# Patient Record
Sex: Female | Born: 1990 | Race: Black or African American | Hispanic: No | Marital: Single | Smoking: Current every day smoker
Health system: Southern US, Community
[De-identification: ages and names within clinical notes are randomized; demographics above are authoritative.]

## PROBLEM LIST (undated history)

## (undated) DIAGNOSIS — F1110 Opioid abuse, uncomplicated: Secondary | ICD-10-CM

## (undated) HISTORY — PX: FRACTURE SURGERY: SHX138

---
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
## 2012-02-09 ENCOUNTER — Encounter (HOSPITAL_COMMUNITY): Payer: Self-pay | Admitting: *Deleted

## 2012-02-09 ENCOUNTER — Emergency Department (HOSPITAL_COMMUNITY)
Admission: EM | Admit: 2012-02-09 | Discharge: 2012-02-09 | Discharge status: Against Medical Advice | Payer: Self-pay | Attending: Emergency Medicine | Admitting: Emergency Medicine

## 2012-02-09 DIAGNOSIS — R109 Unspecified abdominal pain: Secondary | ICD-10-CM | POA: Insufficient documentation

## 2012-02-09 LAB — URINALYSIS, ROUTINE W REFLEX MICROSCOPIC
Ketones, ur: NEGATIVE mg/dL
Leukocytes, UA: NEGATIVE
Nitrite: NEGATIVE
Protein, ur: 30 mg/dL — AB
Urobilinogen, UA: 1 mg/dL (ref 0.0–1.0)

## 2012-02-09 LAB — PREGNANCY, URINE: Preg Test, Ur: NEGATIVE

## 2012-02-09 NOTE — ED Notes (Signed)
edp in to assess pt - pt not in room, gown laying on bed.

## 2012-02-09 NOTE — ED Notes (Signed)
Upper abd pain for 2 days , No nausea or vomiting,.  Wants to be checked for Hepatitis C, says friend of hers is positive and she shared a needle with her.

## 2012-08-28 ENCOUNTER — Emergency Department (HOSPITAL_COMMUNITY)
Admission: EM | Admit: 2012-08-28 | Discharge: 2012-08-28 | Disposition: A | Discharge status: Home / Self Care | Payer: Self-pay | Attending: Emergency Medicine | Admitting: Emergency Medicine

## 2012-08-28 ENCOUNTER — Encounter (HOSPITAL_COMMUNITY): Payer: Self-pay | Admitting: *Deleted

## 2012-08-28 DIAGNOSIS — Z880 Allergy status to penicillin: Secondary | ICD-10-CM | POA: Insufficient documentation

## 2012-08-28 DIAGNOSIS — F17200 Nicotine dependence, unspecified, uncomplicated: Secondary | ICD-10-CM | POA: Insufficient documentation

## 2012-08-28 DIAGNOSIS — N390 Urinary tract infection, site not specified: Secondary | ICD-10-CM | POA: Insufficient documentation

## 2012-08-28 DIAGNOSIS — K921 Melena: Secondary | ICD-10-CM | POA: Insufficient documentation

## 2012-08-28 DIAGNOSIS — R109 Unspecified abdominal pain: Secondary | ICD-10-CM | POA: Insufficient documentation

## 2012-08-28 DIAGNOSIS — K922 Gastrointestinal hemorrhage, unspecified: Secondary | ICD-10-CM | POA: Insufficient documentation

## 2012-08-28 DIAGNOSIS — Z3202 Encounter for pregnancy test, result negative: Secondary | ICD-10-CM | POA: Insufficient documentation

## 2012-08-28 DIAGNOSIS — Z79899 Other long term (current) drug therapy: Secondary | ICD-10-CM | POA: Insufficient documentation

## 2012-08-28 LAB — CBC WITH DIFFERENTIAL/PLATELET
Basophils Absolute: 0 10*3/uL (ref 0.0–0.1)
Eosinophils Absolute: 0.1 10*3/uL (ref 0.0–0.7)
Eosinophils Relative: 1 % (ref 0–5)
HCT: 34.9 % — ABNORMAL LOW (ref 36.0–46.0)
Lymphocytes Relative: 56 % — ABNORMAL HIGH (ref 12–46)
MCH: 31.8 pg (ref 26.0–34.0)
MCV: 91.6 fL (ref 78.0–100.0)
Monocytes Absolute: 0.4 10*3/uL (ref 0.1–1.0)
Platelets: 182 10*3/uL (ref 150–400)
RDW: 12.9 % (ref 11.5–15.5)
WBC: 5.6 10*3/uL (ref 4.0–10.5)

## 2012-08-28 LAB — COMPREHENSIVE METABOLIC PANEL
ALT: 13 U/L (ref 0–35)
AST: 18 U/L (ref 0–37)
Albumin: 4 g/dL (ref 3.5–5.2)
Alkaline Phosphatase: 53 U/L (ref 39–117)
BUN: 4 mg/dL — ABNORMAL LOW (ref 6–23)
CO2: 28 mEq/L (ref 19–32)
Calcium: 9.3 mg/dL (ref 8.4–10.5)
Chloride: 102 mEq/L (ref 96–112)
Creatinine, Ser: 0.65 mg/dL (ref 0.50–1.10)
GFR calc Af Amer: >90 mL/min (ref >90)
GFR calc non Af Amer: >90 mL/min (ref >90)
Glucose, Bld: 83 mg/dL (ref 70–99)
Potassium: 4 mEq/L (ref 3.5–5.1)
Sodium: 137 mEq/L (ref 135–145)
Total Bilirubin: 0.2 mg/dL — ABNORMAL LOW (ref 0.3–1.2)
Total Protein: 7.6 g/dL (ref 6.0–8.3)

## 2012-08-28 LAB — URINALYSIS, ROUTINE W REFLEX MICROSCOPIC
Bilirubin Urine: NEGATIVE
Glucose, UA: NEGATIVE mg/dL
Hgb urine dipstick: NEGATIVE
Ketones, ur: NEGATIVE mg/dL
Nitrite: NEGATIVE
Protein, ur: NEGATIVE mg/dL
Specific Gravity, Urine: >1.03 — ABNORMAL HIGH (ref 1.005–1.030)
Urobilinogen, UA: 0.2 mg/dL (ref 0.0–1.0)
pH: 5.5 (ref 5.0–8.0)

## 2012-08-28 LAB — PREGNANCY, URINE: Preg Test, Ur: NEGATIVE

## 2012-08-28 LAB — URINE MICROSCOPIC-ADD ON

## 2012-08-28 MED ORDER — ACETAMINOPHEN 500 MG PO TABS
1000.0000 mg | ORAL_TABLET | Freq: Once | ORAL | Status: AC
  Administered 2012-08-28: 1000 mg via ORAL
  Filled 2012-08-28: qty 2

## 2012-08-28 MED ORDER — CIPROFLOXACIN HCL 250 MG PO TABS: 250.0000 mg | ORAL_TABLET | Freq: Two times a day (BID) | ORAL | Status: DC

## 2012-08-28 NOTE — ED Provider Notes (Signed)
History  This chart was scribed for Michele Racer, MD by Shari Heritage, ED Scribe. The patient was seen in room [facility]19/[facility]. Patient's care was started at 1540.   CSN: [id number]  Arrival date & time 08/28/12  1517   First MD Initiated Contact with Patient 08/28/12 1540      Chief Complaint  Patient presents with  . GI Bleeding     Patient is a 22 y.o. female presenting with hematochezia. The history is provided by the patient. No language interpreter was used.  Rectal Bleeding  The current episode started today. The problem occurs rarely. The problem has been rapidly improving. The patient is experiencing no pain. The stool is described as bloody. Associated symptoms include abdominal pain. Pertinent negatives include no fever, no diarrhea and no vomiting. She has been behaving normally. She has been eating and drinking normally. Urine output has been normal. Her past medical history does not include recent abdominal injury.     HPI Comments: Michele Mahoney is a 22 y.o. female who presents to the Emergency Department complaining of GI bleeding onset this morning. Patient states that after having a bowel movement this morning, she noticed bright red blood in the toilet and on tissue paper after wiping. She states that she hasn't had any more episodes of blood in stool or noticed any blood in her underwear. Patient has associated lower and right-sided abdominal pain. She states that she woke up with this pain this morning. She has no history of gastrointestinal problems and has never seen a gastroenterologist. She denies fever, chills, vomiting, dysuria or urinary frequency or any other symptoms at this time.   No past medical history on file.  Past Surgical History  Procedure Laterality Date  . Fracture surgery      No family history on file.  History  Substance Use Topics  . Smoking status: Current Every Day Smoker    Types: Cigarettes  . Smokeless tobacco: Not on file  . Alcohol  Use: No    OB History   Grav Para Term Preterm Abortions TAB SAB Ect Mult Living                  Review of Systems  Constitutional: Negative for fever and chills.  Gastrointestinal: Positive for abdominal pain, blood in stool and hematochezia. Negative for vomiting and diarrhea.  Genitourinary: Negative for dysuria and frequency.  All other systems reviewed and are negative.    Allergies  Amoxicillin; Ceclor; Penicillins; and Sulfa antibiotics  Home Medications   Current Outpatient Rx  Name  Route  Sig  Dispense  Refill  . medroxyPROGESTERone (DEPO-PROVERA) 150 MG/ML injection   Intramuscular   Inject 150 mg into the muscle every 3 (three) months.         . ciprofloxacin (CIPRO) 250 MG tablet   Oral   Take 1 tablet (250 mg total) by mouth every 12 (twelve) hours.   10 tablet   0     Triage Vitals: BP 110/67  Pulse 68  Temp(Src) 98.3 F (36.8 C) (Oral)  Resp 18  Ht 5\' 4"  (1.626 m)  Wt 117 lb (53.071 kg)  BMI 20.07 kg/m2  SpO2 100%  Physical Exam  Constitutional: She is oriented to person, place, and time. She appears well-developed and well-nourished. No distress.  HENT:  Head: Normocephalic and atraumatic.  Right Ear: Tympanic membrane, external ear and ear canal normal.  Left Ear: Tympanic membrane, external ear and ear canal normal.  Mouth/Throat: Oropharynx is  clear and moist.  Moist mucous membranes.  Eyes: Conjunctivae and EOM are normal. Pupils are equal, round, and reactive to light.  Neck: Normal range of motion. Neck supple.  Cardiovascular: Normal rate, regular rhythm, normal heart sounds and intact distal pulses.   No murmur heard. Pulmonary/Chest: Effort normal and breath sounds normal.  Abdominal: Soft. There is tenderness. There is no rebound and no guarding.  Mild LUQ and LLQ pain.  Genitourinary: Guaiac positive stool.  Rectal vault is clear. No anal fissures or obvious hemorrhoids. Chaperone present.  Musculoskeletal: Normal range of  motion.  Neurological: She is alert and oriented to person, place, and time. She has normal reflexes.  Moving all extremities. Sensation intact.   Skin: Skin is warm and dry. No rash noted.    ED Course  Procedures (including critical care time) DIAGNOSTIC STUDIES: Oxygen Saturation is 100% on room air, normal by my interpretation.    COORDINATION OF CARE: 3:58 PM- Patient informed of current plan for treatment and evaluation and agrees with plan at this time.    Labs Reviewed  CBC WITH DIFFERENTIAL - Abnormal; Notable for the following:    RBC 3.81 (*)    HCT 34.9 (*)    Neutrophils Relative 36 (*)    Lymphocytes Relative 56 (*)    All other components within normal limits  COMPREHENSIVE METABOLIC PANEL - Abnormal; Notable for the following:    BUN 4 (*)    Total Bilirubin 0.2 (*)    All other components within normal limits  URINALYSIS, ROUTINE W REFLEX MICROSCOPIC - Abnormal; Notable for the following:    APPearance HAZY (*)    Specific Gravity, Urine >1.030 (*)    Leukocytes, UA MODERATE (*)    All other components within normal limits  URINE MICROSCOPIC-ADD ON - Abnormal; Notable for the following:    Squamous Epithelial / LPF FEW (*)    Bacteria, UA FEW (*)    All other components within normal limits  URINE CULTURE  PREGNANCY, URINE    No results found.   1. Acute lower GI bleeding   2. UTI (urinary tract infection)       MDM  I personally performed the services described in this documentation, which was scribed in my presence. The recorded information has been reviewed and is accurate.  Remains well appearing. Abd soft. Advised to f/u with GI for recurrent bleeding. Return precautions given   Michele Racer, MD 08/28/12 1758

## 2012-08-28 NOTE — ED Notes (Signed)
Pt alert & oriented x4, stable gait. Patient given discharge instructions, paperwork & prescription(s). Patient  instructed to stop at the registration desk to finish any additional paperwork. Patient verbalized understanding. Pt left department w/ no further questions. 

## 2012-08-28 NOTE — ED Provider Notes (Deleted)
History     CSN: [id number]  Arrival date & time 08/28/12  1517   First MD Initiated Contact with Patient 08/28/12 1540      Chief Complaint  Patient presents with  . GI Bleeding    (Consider location/radiation/quality/duration/timing/severity/associated sxs/prior treatment) HPI  No past medical history on file.  Past Surgical History  Procedure Laterality Date  . Fracture surgery      No family history on file.  History  Substance Use Topics  . Smoking status: Current Every Day Smoker    Types: Cigarettes  . Smokeless tobacco: Not on file  . Alcohol Use: No    OB History   Grav Para Term Preterm Abortions TAB SAB Ect Mult Living                  Review of Systems  Allergies  Amoxicillin; Ceclor; Penicillins; and Sulfa antibiotics  Home Medications   Current Outpatient Rx  Name  Route  Sig  Dispense  Refill  . medroxyPROGESTERone (DEPO-PROVERA) 150 MG/ML injection   Intramuscular   Inject 150 mg into the muscle every 3 (three) months.         . ciprofloxacin (CIPRO) 250 MG tablet   Oral   Take 1 tablet (250 mg total) by mouth every 12 (twelve) hours.   10 tablet   0     BP 97/74  Pulse 64  Temp(Src) 98.3 F (36.8 C) (Oral)  Resp 16  Ht 5\' 4"  (1.626 m)  Wt 117 lb (53.071 kg)  BMI 20.07 kg/m2  SpO2 98%  Physical Exam  ED Course  Procedures (including critical care time)  Labs Reviewed  CBC WITH DIFFERENTIAL - Abnormal; Notable for the following:    RBC 3.81 (*)    HCT 34.9 (*)    Neutrophils Relative 36 (*)    Lymphocytes Relative 56 (*)    All other components within normal limits  COMPREHENSIVE METABOLIC PANEL - Abnormal; Notable for the following:    BUN 4 (*)    Total Bilirubin 0.2 (*)    All other components within normal limits  URINALYSIS, ROUTINE W REFLEX MICROSCOPIC - Abnormal; Notable for the following:    APPearance HAZY (*)    Specific Gravity, Urine >1.030 (*)    Leukocytes, UA MODERATE (*)    All other components  within normal limits  URINE MICROSCOPIC-ADD ON - Abnormal; Notable for the following:    Squamous Epithelial / LPF FEW (*)    Bacteria, UA FEW (*)    All other components within normal limits  URINE CULTURE  PREGNANCY, URINE   No results found.   1. Acute lower GI bleeding   2. UTI (urinary tract infection)       MDM  I personally performed the services described in this documentation, which was scribed in my presence. The recorded information has been reviewed and is accurate.  Remains well appearing. Abd soft. Advised to f/u with GI for recurrent bleeding. Return precautions given        Loren Racer, MD 08/28/12 1757

## 2012-08-28 NOTE — ED Notes (Signed)
Pt with abd pain and noted bright red blood in stool x 1 today, denies hx of same, states that she noted a clot on toilet paper as well today

## 2012-08-29 LAB — URINE CULTURE
Colony Count: NO GROWTH
Culture: NO GROWTH

## 2015-07-23 ENCOUNTER — Emergency Department (HOSPITAL_COMMUNITY)
Admission: EM | Admit: 2015-07-23 | Discharge: 2015-07-23 | Disposition: A | Discharge status: Home / Self Care | Payer: Self-pay | Attending: Emergency Medicine | Admitting: Emergency Medicine

## 2015-07-23 ENCOUNTER — Encounter (HOSPITAL_COMMUNITY): Payer: Self-pay | Admitting: Emergency Medicine

## 2015-07-23 DIAGNOSIS — F17210 Nicotine dependence, cigarettes, uncomplicated: Secondary | ICD-10-CM | POA: Insufficient documentation

## 2015-07-23 DIAGNOSIS — K0889 Other specified disorders of teeth and supporting structures: Secondary | ICD-10-CM

## 2015-07-23 MED ORDER — NAPROXEN 500 MG PO TABS: 500.0000 mg | ORAL_TABLET | Freq: Two times a day (BID) | ORAL | Status: DC

## 2015-07-23 MED ORDER — NAPROXEN 250 MG PO TABS
500.0000 mg | ORAL_TABLET | Freq: Once | ORAL | Status: AC
  Administered 2015-07-23: 500 mg via ORAL
  Filled 2015-07-23: qty 2

## 2015-07-23 MED ORDER — HYDROCODONE-ACETAMINOPHEN 5-325 MG PO TABS
1.0000 | ORAL_TABLET | Freq: Once | ORAL | Status: AC
  Administered 2015-07-23: 1 via ORAL
  Filled 2015-07-23: qty 1

## 2015-07-23 MED ORDER — HYDROCODONE-ACETAMINOPHEN 5-325 MG PO TABS: 1.0000 | ORAL_TABLET | Freq: Four times a day (QID) | ORAL | Status: DC | PRN

## 2015-07-23 MED ORDER — CLINDAMYCIN HCL 150 MG PO CAPS: 450.0000 mg | ORAL_CAPSULE | Freq: Three times a day (TID) | ORAL | Status: DC

## 2015-07-23 NOTE — ED Notes (Signed)
Patient complaining of left lower dental pain since yesterday.

## 2015-07-23 NOTE — ED Notes (Signed)
Patient given discharge instruction, verbalized understand. Patient ambulatory out of the department.  

## 2015-07-23 NOTE — Discharge Instructions (Signed)
Take antibiotic as directed. Take the Naprosyn as directed. Supplemented with the hydrocodone as needed. Referrals to possible dental clinics provided. Return for any new or worse symptoms. Return for any swelling to the floor the mouth.

## 2015-07-23 NOTE — ED Provider Notes (Signed)
CSN: [id number]     Arrival date & time 07/23/15  1942 History   First MD Initiated Contact with Patient 07/23/15 2032     Chief Complaint  Patient presents with  . Dental Pain     (Consider location/radiation/quality/duration/timing/severity/associated sxs/prior Treatment) Patient is a 25 y.o. female presenting with tooth pain. The history is provided by the patient.  Dental Pain Associated symptoms: no congestion, no drooling, no fever, no headaches and no neck pain    patient with onset of left lower molar tooth pain yesterday. No injury. No fevers. Patient is allergic to Keflex and penicillin. Past medical history is noncontributory. No fevers. No trouble swallowing. No swelling to the floor the mouth.  History reviewed. No pertinent past medical history. Past Surgical History  Procedure Laterality Date  . Fracture surgery     History reviewed. No pertinent family history. Social History  Substance Use Topics  . Smoking status: Current Every Day Smoker    Types: Cigarettes  . Smokeless tobacco: None  . Alcohol Use: No   OB History    No data available     Review of Systems  Constitutional: Negative for fever.  HENT: Positive for dental problem. Negative for congestion, drooling and voice change.   Respiratory: Negative for shortness of breath.   Cardiovascular: Negative for chest pain.  Gastrointestinal: Negative for nausea, vomiting and abdominal pain.  Musculoskeletal: Negative for neck pain.  Skin: Negative for rash.  Neurological: Negative for headaches.  Hematological: Does not bruise/bleed easily.  Psychiatric/Behavioral: Negative for confusion.      Allergies  Amoxicillin; Ceclor; Penicillins; and Sulfa antibiotics  Home Medications   Prior to Admission medications   Medication Sig Start Date End Date Taking? Authorizing Provider  ciprofloxacin (CIPRO) 250 MG tablet Take 1 tablet (250 mg total) by mouth every 12 (twelve) hours. Patient not taking:  Reported on 07/23/2015 08/28/12   Loren Raceravid Yelverton, MD  clindamycin (CLEOCIN) 150 MG capsule Take 3 capsules (450 mg total) by mouth 3 (three) times daily. 07/23/15   Vanetta MuldersScott Yaasir Menken, MD  HYDROcodone-acetaminophen (NORCO/VICODIN) 5-325 MG tablet Take 1-2 tablets by mouth every 6 (six) hours as needed. 07/23/15   Vanetta MuldersScott Zai Chmiel, MD  medroxyPROGESTERone (DEPO-PROVERA) 150 MG/ML injection Inject 150 mg into the muscle every 3 (three) months.    Historical Provider, MD  naproxen (NAPROSYN) 500 MG tablet Take 1 tablet (500 mg total) by mouth 2 (two) times daily. 07/23/15   Vanetta MuldersScott Samreen Seltzer, MD   BP 117/84 mmHg  Pulse 79  Temp(Src) 98.9 F (37.2 C) (Oral)  Resp 14  Ht 5\' 4"  (1.626 m)  Wt 54.432 kg  BMI 20.59 kg/m2  SpO2 100%  LMP 07/10/2015 Physical Exam  Constitutional: She is oriented to person, place, and time. She appears well-developed and well-nourished. No distress.  HENT:  Head: Normocephalic and atraumatic.  Mouth/Throat: Oropharynx is clear and moist.  Significant decay to bilateral lower molars. Tenderness to the left lower second molar. No adenopathy no facial swelling. No swelling to the floor the mouth.  Eyes: Conjunctivae and EOM are normal. Pupils are equal, round, and reactive to light.  Neck: Normal range of motion.  Cardiovascular: Normal rate, regular rhythm and normal heart sounds.   Pulmonary/Chest: Effort normal and breath sounds normal. No respiratory distress.  Abdominal: Soft. Bowel sounds are normal. There is no tenderness.  Neurological: She is alert and oriented to person, place, and time. No cranial nerve deficit. She exhibits normal muscle tone. Coordination normal.  Skin: Skin is  warm. No rash noted.  Nursing note and vitals reviewed.   ED Course  Procedures (including critical care time) Labs Review Labs Reviewed - No data to display  Imaging Review No results found. I have personally reviewed and evaluated these images and lab results as part of my medical  decision-making.   EKG Interpretation None      MDM   Final diagnoses:  Toothache    Patient with tenderness to palpation of left lower second molar. No swelling to the floor the mouth. Consistent with an apical abscess. Will treat with antibiotics patient is allergic to penicillin and Keflex. Will treat with clindamycin. Short course of hydrocodone and Naprosyn. Dental clinic information provided.    Vanetta Mulders, MD 07/23/15 2121

## 2015-07-24 NOTE — ED Notes (Signed)
Pharmacist from [facility] in [place] called and reports that pt could not afford the clindamycin prescribed.  Changed prescription to Erythromycin 250mg  po every 6 hours x 7 days.  No refills.

## 2016-08-02 ENCOUNTER — Encounter (HOSPITAL_COMMUNITY): Payer: Self-pay | Admitting: Emergency Medicine

## 2016-08-02 ENCOUNTER — Emergency Department (HOSPITAL_COMMUNITY)
Admission: EM | Admit: 2016-08-02 | Discharge: 2016-08-02 | Disposition: A | Discharge status: Home / Self Care | Payer: Self-pay | Attending: Emergency Medicine | Admitting: Emergency Medicine

## 2016-08-02 ENCOUNTER — Emergency Department (HOSPITAL_COMMUNITY): Payer: Self-pay

## 2016-08-02 DIAGNOSIS — M25561 Pain in right knee: Secondary | ICD-10-CM | POA: Insufficient documentation

## 2016-08-02 DIAGNOSIS — F17210 Nicotine dependence, cigarettes, uncomplicated: Secondary | ICD-10-CM | POA: Insufficient documentation

## 2016-08-02 DIAGNOSIS — Z79899 Other long term (current) drug therapy: Secondary | ICD-10-CM | POA: Insufficient documentation

## 2016-08-02 MED ORDER — IBUPROFEN 400 MG PO TABS: 400.0000 mg | ORAL_TABLET | Freq: Four times a day (QID) | ORAL | 0 refills | Status: DC | PRN

## 2016-08-02 MED ORDER — IBUPROFEN 400 MG PO TABS
400.0000 mg | ORAL_TABLET | Freq: Once | ORAL | Status: AC
  Administered 2016-08-02: 400 mg via ORAL
  Filled 2016-08-02: qty 1

## 2016-08-02 NOTE — ED Triage Notes (Signed)
Pt c/o intermittent right knee pain since Saturday. Denies injury.

## 2016-08-02 NOTE — Discharge Instructions (Signed)
As discussed, your xrays are negative today but your pain and exam suggests possible patellar tracking syndrome as the source of your symptoms.  Wear the knee sleeve for comfort.  Use ibuprofen in place of tylenol and apply an ice pack as needed for increased pain.

## 2016-08-03 NOTE — ED Provider Notes (Signed)
[facility]-EMERGENCY DEPT Provider Note   CSN: [id number] Arrival date & time: 08/02/16  1121     History   Chief Complaint Chief Complaint  Patient presents with  . Knee Pain    HPI Michele Mahoney is a 26 y.o. female presenting with a 3 day history of right aching knee pain,  Although she endorses a chronic history of occasional bilateral knee pain.  She denies specific injury and has not been overactive, no long standing or other knee stress.  Since Saturday she endorses a "popping" sensation which has been intermittent, but fairly consistent with traversing stairs.  She has had no treatments prior to arrival.  She denies fevers, chills, swelling, radiation of pain.  The history is provided by the patient and a parent.    History reviewed. No pertinent past medical history.  There are no active problems to display for this patient.   Past Surgical History:  Procedure Laterality Date  . FRACTURE SURGERY      OB History    No data available       Home Medications    Prior to Admission medications   Medication Sig Start Date End Date Taking? Authorizing Provider  ciprofloxacin (CIPRO) 250 MG tablet Take 1 tablet (250 mg total) by mouth every 12 (twelve) hours. Patient not taking: Reported on 07/23/2015 08/28/12   Loren Racer, MD  clindamycin (CLEOCIN) 150 MG capsule Take 3 capsules (450 mg total) by mouth 3 (three) times daily. 07/23/15   Vanetta Mulders, MD  HYDROcodone-acetaminophen (NORCO/VICODIN) 5-325 MG tablet Take 1-2 tablets by mouth every 6 (six) hours as needed. 07/23/15   Vanetta Mulders, MD  ibuprofen (ADVIL,MOTRIN) 400 MG tablet Take 1 tablet (400 mg total) by mouth every 6 (six) hours as needed. 08/02/16   Burgess Amor, PA-C  medroxyPROGESTERone (DEPO-PROVERA) 150 MG/ML injection Inject 150 mg into the muscle every 3 (three) months.    Historical Provider, MD  naproxen (NAPROSYN) 500 MG tablet Take 1 tablet (500 mg total) by mouth 2 (two) times daily. 07/23/15   Vanetta Mulders, MD    Family History No family history on file.  Social History Social History  Substance Use Topics  . Smoking status: Current Every Day Smoker    Types: Cigarettes  . Smokeless tobacco: Never Used  . Alcohol use No     Allergies   Amoxicillin; Ceclor [cefaclor]; Penicillins; and Sulfa antibiotics   Review of Systems Review of Systems  Constitutional: Negative for fever.  Musculoskeletal: Positive for arthralgias. Negative for joint swelling and myalgias.  Neurological: Negative for weakness and numbness.     Physical Exam Updated Vital Signs BP 107/74   Pulse 70   Temp 98.4 F (36.9 C) (Oral)   Resp 16   Ht 5\' 4"  (1.626 m)   Wt 54.4 kg   SpO2 97%   BMI 20.60 kg/m   Physical Exam  Constitutional: She appears well-developed and well-nourished.  HENT:  Head: Atraumatic.  Neck: Normal range of motion.  Cardiovascular:  Pulses equal bilaterally  Musculoskeletal: She exhibits tenderness.       Right knee: She exhibits abnormal patellar mobility. She exhibits no swelling, no effusion, no deformity, normal alignment, no LCL laxity, normal meniscus and no MCL laxity. Tenderness found. Patellar tendon tenderness noted.  Increased bilateral patellar mobility, right more than left.  There is crepitus with right knee flexion.  No pain with valgus/varus strain.  Negative drawer test.   Neurological: She is alert. She has normal strength. She  displays normal reflexes. No sensory deficit.  Skin: Skin is warm and dry.  Psychiatric: She has a normal mood and affect.     ED Treatments / Results  Labs (all labs ordered are listed, but only abnormal results are displayed) Labs Reviewed - No data to display  EKG  EKG Interpretation None       Radiology Dg Knee Complete 4 Views Right  Result Date: 08/02/2016 CLINICAL DATA:  Onset of right knee pain 4 days ago without known injury. EXAM: RIGHT KNEE - COMPLETE 4+ VIEW COMPARISON:  None in PACs FINDINGS: The  bones are subjectively adequately mineralized. There is no acute fracture or dislocation. The joint spaces are well maintained. There is no joint effusion. IMPRESSION: There is no acute or significant chronic bony abnormality of the right knee. Electronically Signed   By: David  SwazilandJordan M.D.   On: 08/02/2016 11:55    Procedures Procedures (including critical care time)  Medications Ordered in ED Medications  ibuprofen (ADVIL,MOTRIN) tablet 400 mg (400 mg Oral Given 08/02/16 1329)     Initial Impression / Assessment and Plan / ED Course  I have reviewed the triage vital signs and the nursing notes.  Pertinent labs & imaging results that were available during my care of the patient were reviewed by me and considered in my medical decision making (see chart for details).     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  Pt was given a knee sleeve for support, advised activities as tolerated. Ibuprofen.  Referral to ortho for further eval and f/u care.  Final Clinical Impressions(s) / ED Diagnoses   Final diagnoses:  Acute pain of right knee    New Prescriptions Discharge Medication List as of 08/02/2016 12:54 PM    START taking these medications   Details  ibuprofen (ADVIL,MOTRIN) 400 MG tablet Take 1 tablet (400 mg total) by mouth every 6 (six) hours as needed., Starting Tue 08/02/2016, Print         Burgess AmorJulie Dimitrios Balestrieri, PA-C 08/03/16 [id number]    Samuel JesterKathleen McManus, DO 08/05/16 1721

## 2016-11-29 ENCOUNTER — Encounter (HOSPITAL_COMMUNITY): Payer: Self-pay | Admitting: Emergency Medicine

## 2016-11-29 ENCOUNTER — Emergency Department (HOSPITAL_COMMUNITY)
Admission: EM | Admit: 2016-11-29 | Discharge: 2016-11-29 | Disposition: A | Discharge status: Home / Self Care | Payer: Self-pay | Attending: Emergency Medicine | Admitting: Emergency Medicine

## 2016-11-29 DIAGNOSIS — K047 Periapical abscess without sinus: Secondary | ICD-10-CM | POA: Insufficient documentation

## 2016-11-29 DIAGNOSIS — Z79899 Other long term (current) drug therapy: Secondary | ICD-10-CM | POA: Insufficient documentation

## 2016-11-29 DIAGNOSIS — F17210 Nicotine dependence, cigarettes, uncomplicated: Secondary | ICD-10-CM | POA: Insufficient documentation

## 2016-11-29 MED ORDER — CLINDAMYCIN HCL 150 MG PO CAPS: 450.0000 mg | ORAL_CAPSULE | Freq: Three times a day (TID) | ORAL | 0 refills | Status: AC

## 2016-11-29 MED ORDER — CLINDAMYCIN HCL 150 MG PO CAPS
300.0000 mg | ORAL_CAPSULE | Freq: Once | ORAL | Status: AC
  Administered 2016-11-29: 300 mg via ORAL
  Filled 2016-11-29: qty 2

## 2016-11-29 MED ORDER — HYDROCODONE-ACETAMINOPHEN 5-325 MG PO TABS: 1.0000 | ORAL_TABLET | ORAL | 0 refills | Status: DC | PRN

## 2016-11-29 MED ORDER — HYDROCODONE-ACETAMINOPHEN 5-325 MG PO TABS
1.0000 | ORAL_TABLET | Freq: Once | ORAL | Status: AC
  Administered 2016-11-29: 1 via ORAL
  Filled 2016-11-29: qty 1

## 2016-11-29 NOTE — ED Notes (Signed)
Pt alert & oriented x4, stable gait. Patient given discharge instructions, paperwork & prescription(s). Patient informed not to drive, operate any equipment & handel any important documents 4 hours after taking pain medication. Patient  instructed to stop at the registration desk to finish any additional paperwork. Patient  verbalized understanding. Pt left department w/ no further questions. 

## 2016-11-29 NOTE — Discharge Instructions (Signed)
Complete your entire course of antibiotics as prescribed.  You  may use the hydrocodone for pain relief but do not drive within 4 hours of taking as this will make you drowsy.  Avoid applying heat or ice to this abscess area which can worsen your symptoms.  You may use warm salt water swish and spit treatment or half peroxide and water swish and spit after meals to keep this area clean as discussed.  Call the dentist listed above for further management of your symptoms.  

## 2016-11-29 NOTE — ED Triage Notes (Signed)
Pt c/o abscess in mouth draining today.

## 2016-11-29 NOTE — ED Notes (Signed)
Noted drainage from the gum.

## 2016-11-29 NOTE — ED Provider Notes (Signed)
[facility] Provider Note   CSN: [id number] Arrival date & time: 11/29/16  2025     History   Chief Complaint Chief Complaint  Patient presents with  . Dental Pain    HPI Michele Mahoney is a 26 y.o. female one week history of dental pain and gingival swelling.   The patient has a history of injury and/or decay in the tooth involved which has recently started to cause increased  pain.  There has been no fevers, chills, nausea or vomiting, also no complaint of difficulty swallowing, although chewing makes pain worse.  She noticed a white filled blister along the gum line last week that has started draining today.The patient has had no treatment prior to arrival.   .  HPI  History reviewed. No pertinent past medical history.  There are no active problems to display for this patient.   Past Surgical History:  Procedure Laterality Date  . FRACTURE SURGERY      OB History    No data available       Home Medications    Prior to Admission medications   Medication Sig Start Date End Date Taking? Authorizing Provider  ciprofloxacin (CIPRO) 250 MG tablet Take 1 tablet (250 mg total) by mouth every 12 (twelve) hours. Patient not taking: Reported on 07/23/2015 08/28/12   Loren Racer, MD  clindamycin (CLEOCIN) 150 MG capsule Take 3 capsules (450 mg total) by mouth 3 (three) times daily. 11/29/16 12/09/16  Burgess Amor, PA-C  HYDROcodone-acetaminophen (NORCO/VICODIN) 5-325 MG tablet Take 1 tablet by mouth every 4 (four) hours as needed. 11/29/16   Burgess Amor, PA-C  ibuprofen (ADVIL,MOTRIN) 400 MG tablet Take 1 tablet (400 mg total) by mouth every 6 (six) hours as needed. 08/02/16   Burgess Amor, PA-C  medroxyPROGESTERone (DEPO-PROVERA) 150 MG/ML injection Inject 150 mg into the muscle every 3 (three) months.    [provider]  naproxen (NAPROSYN) 500 MG tablet Take 1 tablet (500 mg total) by mouth 2 (two) times daily. 07/23/15   Vanetta Mulders, MD    Family  History History reviewed. No pertinent family history.  Social History Social History  Substance Use Topics  . Smoking status: Current Every Day Smoker    Types: Cigarettes  . Smokeless tobacco: Never Used  . Alcohol use No     Allergies   Amoxicillin; Ceclor [cefaclor]; Penicillins; and Sulfa antibiotics   Review of Systems Review of Systems  Constitutional: Negative for fever.  HENT: Positive for dental problem. Negative for facial swelling and sore throat.   Respiratory: Negative for shortness of breath.   Musculoskeletal: Negative for neck pain and neck stiffness.     Physical Exam Updated Vital Signs BP 130/68 (BP Location: Right Arm)   Pulse (!) 113   Temp 98.6 F (37 C) (Oral)   Resp 16   Ht 5\' 4"  (1.626 m)   Wt 51.7 kg (114 lb)   SpO2 100%   BMI 19.57 kg/m   Physical Exam  Constitutional: She is oriented to person, place, and time. She appears well-developed and well-nourished. No distress.  HENT:  Head: Normocephalic and atraumatic.  Right Ear: Tympanic membrane and external ear normal.  Left Ear: Tympanic membrane and external ear normal.  Mouth/Throat: Oropharynx is clear and moist and mucous membranes are normal. No oral lesions. No trismus in the jaw. Dental abscesses present.    Eyes: Conjunctivae are normal.  Neck: Normal range of motion. Neck supple.  Cardiovascular: Normal rate and normal heart sounds.  Pulmonary/Chest: Effort normal.  Abdominal: She exhibits no distension.  Musculoskeletal: Normal range of motion.  Lymphadenopathy:    She has no cervical adenopathy.  Neurological: She is alert and oriented to person, place, and time.  Skin: Skin is warm and dry. No erythema.  Psychiatric: She has a normal mood and affect.     ED Treatments / Results  Labs (all labs ordered are listed, but only abnormal results are displayed) Labs Reviewed - No data to display  EKG  EKG Interpretation None       Radiology No results  found.  Procedures Procedures (including critical care time)  Medications Ordered in ED Medications  clindamycin (CLEOCIN) capsule 300 mg (300 mg Oral Given 11/29/16 2152)  HYDROcodone-acetaminophen (NORCO/VICODIN) 5-325 MG per tablet 1 tablet (1 tablet Oral Given 11/29/16 2152)     Initial Impression / Assessment and Plan / ED Course  I have reviewed the triage vital signs and the nursing notes.  Pertinent labs & imaging results that were available during my care of the patient were reviewed by me and considered in my medical decision making (see chart for details).     Dental abscess, no oropharynx edema or compromise.  Referrals given for definitive tx.  [place] controlled substance database reviewed with no entries.   Final Clinical Impressions(s) / ED Diagnoses   Final diagnoses:  Dental abscess    New Prescriptions New Prescriptions   CLINDAMYCIN (CLEOCIN) 150 MG CAPSULE    Take 3 capsules (450 mg total) by mouth 3 (three) times daily.   HYDROCODONE-ACETAMINOPHEN (NORCO/VICODIN) 5-325 MG TABLET    Take 1 tablet by mouth every 4 (four) hours as needed.     Burgess Amordol, Dianelly Ferran, PA-C 11/29/16 2158    Samuel JesterMcManus, Kathleen, DO 12/02/16 1519

## 2016-12-02 ENCOUNTER — Emergency Department (HOSPITAL_COMMUNITY): Payer: No Typology Code available for payment source

## 2016-12-02 ENCOUNTER — Encounter (HOSPITAL_COMMUNITY): Payer: Self-pay | Admitting: *Deleted

## 2016-12-02 ENCOUNTER — Emergency Department (HOSPITAL_COMMUNITY)
Admission: EM | Admit: 2016-12-02 | Discharge: 2016-12-02 | Disposition: A | Discharge status: Home / Self Care | Payer: No Typology Code available for payment source | Attending: Emergency Medicine | Admitting: Emergency Medicine

## 2016-12-02 DIAGNOSIS — Z79899 Other long term (current) drug therapy: Secondary | ICD-10-CM | POA: Diagnosis not present

## 2016-12-02 DIAGNOSIS — Y92410 Unspecified street and highway as the place of occurrence of the external cause: Secondary | ICD-10-CM | POA: Diagnosis not present

## 2016-12-02 DIAGNOSIS — F17210 Nicotine dependence, cigarettes, uncomplicated: Secondary | ICD-10-CM | POA: Insufficient documentation

## 2016-12-02 DIAGNOSIS — Y999 Unspecified external cause status: Secondary | ICD-10-CM | POA: Diagnosis not present

## 2016-12-02 DIAGNOSIS — S161XXA Strain of muscle, fascia and tendon at neck level, initial encounter: Secondary | ICD-10-CM | POA: Diagnosis not present

## 2016-12-02 DIAGNOSIS — S199XXA Unspecified injury of neck, initial encounter: Secondary | ICD-10-CM | POA: Diagnosis present

## 2016-12-02 DIAGNOSIS — Y9389 Activity, other specified: Secondary | ICD-10-CM | POA: Insufficient documentation

## 2016-12-02 MED ORDER — CYCLOBENZAPRINE HCL 5 MG PO TABS: 5.0000 mg | ORAL_TABLET | Freq: Three times a day (TID) | ORAL | 0 refills | Status: DC | PRN
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
## 2017-01-27 ENCOUNTER — Emergency Department (HOSPITAL_COMMUNITY)
Admission: EM | Admit: 2017-01-27 | Discharge: 2017-01-27 | Disposition: A | Discharge status: Home / Self Care | Payer: Self-pay | Attending: Emergency Medicine | Admitting: Emergency Medicine

## 2017-01-27 ENCOUNTER — Encounter (HOSPITAL_COMMUNITY): Payer: Self-pay | Admitting: Cardiology

## 2017-01-27 ENCOUNTER — Emergency Department (HOSPITAL_COMMUNITY): Payer: Self-pay

## 2017-01-27 DIAGNOSIS — N3001 Acute cystitis with hematuria: Secondary | ICD-10-CM | POA: Insufficient documentation

## 2017-01-27 DIAGNOSIS — Z79899 Other long term (current) drug therapy: Secondary | ICD-10-CM | POA: Insufficient documentation

## 2017-01-27 DIAGNOSIS — F17210 Nicotine dependence, cigarettes, uncomplicated: Secondary | ICD-10-CM | POA: Insufficient documentation

## 2017-01-27 LAB — URINALYSIS, ROUTINE W REFLEX MICROSCOPIC
Bilirubin Urine: NEGATIVE
Glucose, UA: NEGATIVE mg/dL
Ketones, ur: NEGATIVE mg/dL
NITRITE: POSITIVE — AB
PROTEIN: 100 mg/dL — AB
SPECIFIC GRAVITY, URINE: 1.023 (ref 1.005–1.030)
pH: 7 (ref 5.0–8.0)

## 2017-01-27 LAB — COMPREHENSIVE METABOLIC PANEL
ALBUMIN: 4.5 g/dL (ref 3.5–5.0)
ALK PHOS: 63 U/L (ref 38–126)
ALT: 15 U/L (ref 14–54)
AST: 22 U/L (ref 15–41)
Anion gap: 11 (ref 5–15)
BILIRUBIN TOTAL: 0.7 mg/dL (ref 0.3–1.2)
BUN: 11 mg/dL (ref 6–20)
CALCIUM: 9.6 mg/dL (ref 8.9–10.3)
CO2: 24 mmol/L (ref 22–32)
Chloride: 101 mmol/L (ref 101–111)
Creatinine, Ser: 0.81 mg/dL (ref 0.44–1.00)
GFR calc Af Amer: >60 mL/min (ref >60)
GFR calc non Af Amer: >60 mL/min (ref >60)
GLUCOSE: 106 mg/dL — AB (ref 65–99)
POTASSIUM: 4 mmol/L (ref 3.5–5.1)
Sodium: 136 mmol/L (ref 135–145)
TOTAL PROTEIN: 8.8 g/dL — AB (ref 6.5–8.1)

## 2017-01-27 LAB — PREGNANCY, URINE: PREG TEST UR: NEGATIVE

## 2017-01-27 LAB — LIPASE, BLOOD: Lipase: 24 U/L (ref 11–51)

## 2017-01-27 LAB — CBC
HEMATOCRIT: 40.3 % (ref 36.0–46.0)
HEMOGLOBIN: 13.3 g/dL (ref 12.0–15.0)
MCH: 31.3 pg (ref 26.0–34.0)
MCHC: 33 g/dL (ref 30.0–36.0)
MCV: 94.8 fL (ref 78.0–100.0)
Platelets: 216 10*3/uL (ref 150–400)
RBC: 4.25 MIL/uL (ref 3.87–5.11)
RDW: 13.6 % (ref 11.5–15.5)
WBC: 14.3 10*3/uL — ABNORMAL HIGH (ref 4.0–10.5)

## 2017-01-27 MED ORDER — HYDROMORPHONE HCL 1 MG/ML IJ SOLN
0.5000 mg | Freq: Once | INTRAMUSCULAR | Status: AC
  Administered 2017-01-27: 0.5 mg via INTRAVENOUS
  Filled 2017-01-27: qty 1

## 2017-01-27 MED ORDER — CIPROFLOXACIN HCL 500 MG PO TABS: 500.0000 mg | ORAL_TABLET | Freq: Two times a day (BID) | ORAL | 0 refills | Status: DC

## 2017-01-27 MED ORDER — KETOROLAC TROMETHAMINE 30 MG/ML IJ SOLN
30.0000 mg | Freq: Once | INTRAMUSCULAR | Status: AC
  Administered 2017-01-27: 30 mg via INTRAVENOUS
  Filled 2017-01-27: qty 1

## 2017-01-27 MED ORDER — CIPROFLOXACIN IN D5W 400 MG/200ML IV SOLN
400.0000 mg | Freq: Once | INTRAVENOUS | Status: AC
  Administered 2017-01-27: 400 mg via INTRAVENOUS
  Filled 2017-01-27: qty 200

## 2017-01-27 MED ORDER — TRAMADOL HCL 50 MG PO TABS: 50.0000 mg | ORAL_TABLET | Freq: Four times a day (QID) | ORAL | 0 refills | Status: DC | PRN

## 2017-01-27 MED ORDER — ONDANSETRON HCL 4 MG/2ML IJ SOLN
4.0000 mg | Freq: Once | INTRAMUSCULAR | Status: AC
  Administered 2017-01-27: 4 mg via INTRAVENOUS
  Filled 2017-01-27: qty 2

## 2017-01-27 NOTE — Discharge Instructions (Signed)
Drink plenty of fluids and follow-up with your doctor if you finish the antibiotics

## 2017-01-27 NOTE — ED Triage Notes (Signed)
Left sided abdominal pain since yesterday.  States pain is radiating into back.  Vomited times 4 .  Pain worse with urination.

## 2017-01-27 NOTE — ED Notes (Signed)
Pt ambulatory to restroom at this time. 

## 2017-01-27 NOTE — ED Triage Notes (Signed)
Reports LLQ pain since yesterday   NV 3-4 times today

## 2017-01-27 NOTE — ED Provider Notes (Signed)
[facility] Provider Note   CSN: [id number] Arrival date & time: 01/27/17  1316     History   Chief Complaint Chief Complaint  Patient presents with  . Abdominal Pain    HPI Michele Mahoney is a 26 y.o. female.  Patient complains of left lower quadrant pain and left flank pain. No fever no vomiting   The history is provided by the patient.  Abdominal Pain   This is a new problem. The current episode started 2 days ago. The problem occurs constantly. The problem has not changed since onset.The pain is associated with an unknown factor. The pain is located in the LLQ. The quality of the pain is aching. The pain is at a severity of 5/10. The pain is moderate. Pertinent negatives include diarrhea, frequency, hematuria and headaches.    History reviewed. No pertinent past medical history.  There are no active problems to display for this patient.   Past Surgical History:  Procedure Laterality Date  . FRACTURE SURGERY      OB History    No data available       Home Medications    Prior to Admission medications   Medication Sig Start Date End Date Taking? Authorizing Provider  acetaminophen (TYLENOL) 500 MG tablet Take 1,000 mg by mouth every 6 (six) hours as needed.   Yes [provider]  ibuprofen (ADVIL,MOTRIN) 200 MG tablet Take 200 mg by mouth every 6 (six) hours as needed.   Yes [provider]  ciprofloxacin (CIPRO) 500 MG tablet Take 1 tablet (500 mg total) by mouth 2 (two) times daily. One po bid x 7 days 01/27/17   Bethann Berkshire, MD  cyclobenzaprine (FLEXERIL) 5 MG tablet Take 1 tablet (5 mg total) by mouth 3 (three) times daily as needed for muscle spasms. Patient not taking: Reported on 01/27/2017 12/02/16   Burgess Amor, PA-C  HYDROcodone-acetaminophen (NORCO/VICODIN) 5-325 MG tablet Take 1 tablet by mouth every 4 (four) hours as needed. Patient not taking: Reported on 01/27/2017 11/29/16   Burgess Amor, PA-C  medroxyPROGESTERone  (DEPO-PROVERA) 150 MG/ML injection Inject 150 mg into the muscle every 3 (three) months.    [provider]  traMADol (ULTRAM) 50 MG tablet Take 1 tablet (50 mg total) by mouth every 6 (six) hours as needed. 01/27/17   Bethann Berkshire, MD    Family History History reviewed. No pertinent family history.  Social History Social History  Substance Use Topics  . Smoking status: Current Every Day Smoker    Types: Cigarettes  . Smokeless tobacco: Never Used  . Alcohol use No     Allergies   Amoxicillin; Ceclor [cefaclor]; Penicillins; and Sulfa antibiotics   Review of Systems Review of Systems  Constitutional: Negative for appetite change and fatigue.  HENT: Negative for congestion, ear discharge and sinus pressure.   Eyes: Negative for discharge.  Respiratory: Negative for cough.   Cardiovascular: Negative for chest pain.  Gastrointestinal: Positive for abdominal pain. Negative for diarrhea.  Genitourinary: Negative for frequency and hematuria.  Musculoskeletal: Positive for back pain.  Skin: Negative for rash.  Neurological: Negative for seizures and headaches.  Psychiatric/Behavioral: Negative for hallucinations.     Physical Exam Updated Vital Signs BP 112/78 (BP Location: Right Arm)   Pulse 69   Temp 98.3 F (36.8 C) (Oral)   Resp 16   Ht  (1.626 m)   Wt 52.2 kg (115 lb)   SpO2 100%   BMI 19.74 kg/m   Physical Exam  Constitutional: She is oriented to person, place, and time. She appears well-developed.  HENT:  Head: Normocephalic.  Eyes: Conjunctivae and EOM are normal. No scleral icterus.  Neck: Neck supple. No thyromegaly present.  Cardiovascular: Normal rate and regular rhythm.  Exam reveals no gallop and no friction rub.   No murmur heard. Pulmonary/Chest: No stridor. She has no wheezes. She has no rales. She exhibits no tenderness.  Abdominal: She exhibits no distension. There is no tenderness. There is no rebound.  Tender left flank and left  lower quadrant  Musculoskeletal: Normal range of motion. She exhibits no edema.  Lymphadenopathy:    She has no cervical adenopathy.  Neurological: She is oriented to person, place, and time. She exhibits normal muscle tone. Coordination normal.  Skin: No rash noted. No erythema.  Psychiatric: She has a normal mood and affect. Her behavior is normal.     ED Treatments / Results  Labs (all labs ordered are listed, but only abnormal results are displayed) Labs Reviewed  COMPREHENSIVE METABOLIC PANEL - Abnormal; Notable for the following:       Result Value   Glucose, Bld 106 (*)    Total Protein 8.8 (*)    All other components within normal limits  CBC - Abnormal; Notable for the following:    WBC 14.3 (*)    All other components within normal limits  URINALYSIS, ROUTINE W REFLEX MICROSCOPIC - Abnormal; Notable for the following:    APPearance CLOUDY (*)    Hgb urine dipstick MODERATE (*)    Protein, ur 100 (*)    Nitrite POSITIVE (*)    Leukocytes, UA LARGE (*)    Bacteria, UA RARE (*)    Squamous Epithelial / LPF 0-5 (*)    All other components within normal limits  URINE CULTURE  LIPASE, BLOOD  PREGNANCY, URINE    EKG  EKG Interpretation None       Radiology Ct Renal Stone Study  Result Date: 01/27/2017 CLINICAL DATA:  Left-sided abdominal pain and vomiting EXAM: CT ABDOMEN AND PELVIS WITHOUT CONTRAST TECHNIQUE: Multidetector CT imaging of the abdomen and pelvis was performed following the standard protocol without oral or intravenous contrast material administration. COMPARISON:  July 25, 2014 FINDINGS: Lower chest:  Lung bases are clear. Hepatobiliary: No focal liver lesions are evident on this noncontrast enhanced study. There is a Riedel's lobe on the right, an anatomic variant. Gallbladder wall is not appreciably thickened. There is no biliary duct dilatation. Pancreas: No pancreatic mass or inflammatory focus evident. Spleen: No splenic lesions are evident.  Adrenals/Urinary Tract: Adrenals appear normal bilaterally. There is no appreciable renal mass or perinephric stranding on either side. There is mild hydronephrosis on the left. There is no appreciable hydronephrosis on the right. There is 1 slight nephrocalcinosis bilaterally. There is no well-defined intrarenal calculus on either side. There is no appreciable ureteral calculus on either side. Urinary bladder is midline with wall thickness within normal limits. Stomach/Bowel: There is no appreciable bowel wall or mesenteric thickening. There is moderate stool throughout the colon. There is no evident bowel obstruction. There is no free air or portal venous air. Vascular/Lymphatic: There is no abdominal aortic aneurysm. No vascular lesions are evident on this noncontrast enhanced study. There is no appreciable adenopathy in the abdomen or pelvis. Reproductive: Uterus is retroverted. No pelvic mass evident. There are physiologic appearing follicles in each ovary. Other: Appendix is not well seen. There is no periappendiceal region inflammatory change. There is no abscess or ascites  in the abdomen or pelvis. Musculoskeletal: There are no blastic or lytic bone lesions. There is no intramuscular or abdominal wall lesion evident. IMPRESSION: 1. Mild hydronephrosis on the left. No perinephric stranding on the left. No renal or ureteral calculus evident on either side. There is slight nephrocalcinosis bilaterally. The appearance of the left kidney raises question of recent calculus passage on the left. It should be noted that early pyelonephritis on the left could present in this manner. Note that there is no demonstrable renal abscess on this noncontrast enhanced study. 2. Appendix not well seen. No right lower quadrant/periappendiceal region inflammation evident. 3. No evident abscess in the abdomen or pelvis. No bowel obstruction. Electronically Signed   By: Bretta Bang III M.D.   On: 01/27/2017 16:14     Procedures Procedures (including critical care time)  Medications Ordered in ED Medications  ketorolac (TORADOL) 30 MG/ML injection 30 mg (30 mg Intravenous Given 01/27/17 1414)  ondansetron (ZOFRAN) injection 4 mg (4 mg Intravenous Given 01/27/17 1414)  ciprofloxacin (CIPRO) IVPB 400 mg (0 mg Intravenous Stopped 01/27/17 1611)  HYDROmorphone (DILAUDID) injection 0.5 mg (0.5 mg Intravenous Given 01/27/17 1540)     Initial Impression / Assessment and Plan / ED Course  I have reviewed the triage vital signs and the nursing notes.  Pertinent labs & imaging results that were available during my care of the patient were reviewed by me and considered in my medical decision making (see chart for details).     Patient with urinary tract infection. Patient will be placed on Cipro and Ultram and will follow-up with her doctor after finishing the antibiotics  Final Clinical Impressions(s) / ED Diagnoses   Final diagnoses:  Acute cystitis with hematuria    New Prescriptions New Prescriptions   CIPROFLOXACIN (CIPRO) 500 MG TABLET    Take 1 tablet (500 mg total) by mouth 2 (two) times daily. One po bid x 7 days   TRAMADOL (ULTRAM) 50 MG TABLET    Take 1 tablet (50 mg total) by mouth every 6 (six) hours as needed.     Bethann Berkshire, MD 01/27/17 1740

## 2017-01-30 LAB — URINE CULTURE: Culture: >=100000 — AB

## 2017-01-31 ENCOUNTER — Telehealth: Payer: Self-pay | Admitting: *Deleted

## 2017-01-31 NOTE — Telephone Encounter (Signed)
Post ED Visit - Positive Culture Follow-up  Culture report reviewed by antimicrobial stewardship pharmacist:   Enzo Bi, Pharm.D.  Celedonio Miyamoto, Pharm.D., BCPS AQ-ID  Garvin Fila, Pharm.D., BCPS  Georgina Pillion, Pharm.D., BCPS  [place], [address].D., BCPS, AAHIVP  Estella Husk, Pharm.D., BCPS, AAHIVP  Lysle Pearl, PharmD, BCPS  Casilda Carls, PharmD, BCPS  Pollyann Samples, PharmD, BCPS  Positive urine culture Treated with Ciprofloxacin HCL, organism sensitive to the same and no further patient follow-up is required at this time.  Virl Axe [facility] 01/31/2017, 10:01 AM

## 2017-10-04 ENCOUNTER — Encounter (HOSPITAL_COMMUNITY): Payer: Self-pay

## 2017-10-04 ENCOUNTER — Emergency Department (HOSPITAL_COMMUNITY)
Admission: EM | Admit: 2017-10-04 | Discharge: 2017-10-04 | Disposition: A | Discharge status: Home / Self Care | Payer: Self-pay | Attending: Emergency Medicine | Admitting: Emergency Medicine

## 2017-10-04 ENCOUNTER — Other Ambulatory Visit: Payer: Self-pay

## 2017-10-04 ENCOUNTER — Emergency Department (HOSPITAL_COMMUNITY): Payer: Self-pay

## 2017-10-04 DIAGNOSIS — N390 Urinary tract infection, site not specified: Secondary | ICD-10-CM | POA: Insufficient documentation

## 2017-10-04 DIAGNOSIS — F17210 Nicotine dependence, cigarettes, uncomplicated: Secondary | ICD-10-CM | POA: Insufficient documentation

## 2017-10-04 LAB — CBC
HCT: 34.9 % — ABNORMAL LOW (ref 36.0–46.0)
Hemoglobin: 11.4 g/dL — ABNORMAL LOW (ref 12.0–15.0)
MCH: 29.9 pg (ref 26.0–34.0)
MCHC: 32.7 g/dL (ref 30.0–36.0)
MCV: 91.6 fL (ref 78.0–100.0)
Platelets: 233 10*3/uL (ref 150–400)
RBC: 3.81 MIL/uL — ABNORMAL LOW (ref 3.87–5.11)
RDW: 12.7 % (ref 11.5–15.5)
WBC: 9.9 10*3/uL (ref 4.0–10.5)

## 2017-10-04 LAB — PREGNANCY, URINE: Preg Test, Ur: NEGATIVE

## 2017-10-04 LAB — URINALYSIS, ROUTINE W REFLEX MICROSCOPIC
Bilirubin Urine: NEGATIVE
Glucose, UA: NEGATIVE mg/dL
Ketones, ur: NEGATIVE mg/dL
Nitrite: POSITIVE — AB
Protein, ur: 30 mg/dL — AB
Specific Gravity, Urine: 1.019 (ref 1.005–1.030)
pH: 5 (ref 5.0–8.0)

## 2017-10-04 LAB — COMPREHENSIVE METABOLIC PANEL
ALT: 13 U/L — ABNORMAL LOW (ref 14–54)
AST: 19 U/L (ref 15–41)
Albumin: 3.8 g/dL (ref 3.5–5.0)
Alkaline Phosphatase: 59 U/L (ref 38–126)
Anion gap: 10 (ref 5–15)
BUN: 6 mg/dL (ref 6–20)
CO2: 26 mmol/L (ref 22–32)
Calcium: 9.3 mg/dL (ref 8.9–10.3)
Chloride: 97 mmol/L — ABNORMAL LOW (ref 101–111)
Creatinine, Ser: 0.66 mg/dL (ref 0.44–1.00)
GFR calc Af Amer: >60 mL/min (ref >60)
GFR calc non Af Amer: >60 mL/min (ref >60)
Glucose, Bld: 93 mg/dL (ref 65–99)
Potassium: 3.5 mmol/L (ref 3.5–5.1)
Sodium: 133 mmol/L — ABNORMAL LOW (ref 135–145)
Total Bilirubin: 0.6 mg/dL (ref 0.3–1.2)
Total Protein: 8.3 g/dL — ABNORMAL HIGH (ref 6.5–8.1)

## 2017-10-04 LAB — LIPASE, BLOOD: Lipase: 20 U/L (ref 11–51)

## 2017-10-04 MED ORDER — CIPROFLOXACIN HCL 250 MG PO TABS
500.0000 mg | ORAL_TABLET | Freq: Once | ORAL | Status: AC
Start: 2017-10-04 — End: 2017-10-04
  Administered 2017-10-04: 500 mg via ORAL
  Filled 2017-10-04: qty 2

## 2017-10-04 MED ORDER — ACETAMINOPHEN 325 MG PO TABS
650.0000 mg | ORAL_TABLET | Freq: Once | ORAL | Status: AC
  Administered 2017-10-04: 650 mg via ORAL
  Filled 2017-10-04: qty 2

## 2017-10-04 MED ORDER — CIPROFLOXACIN HCL 500 MG PO TABS: 500.0000 mg | ORAL_TABLET | Freq: Two times a day (BID) | ORAL | 0 refills | Status: DC

## 2017-10-04 NOTE — ED Triage Notes (Addendum)
Pt reports left side pain that goes into back and mid-lower abdomen. Pain started 2 days ago. Pain increased approx one hour ago. Vomited x 1. Denies urinary symptoms Reports occasional cough

## 2017-10-07 LAB — URINE CULTURE: Culture: 60000 — AB

## 2017-10-07 NOTE — ED Provider Notes (Signed)
[facility] EMERGENCY DEPARTMENT Provider Note   CSN: [id number] Arrival date & time: 10/04/17  1317     History   Chief Complaint Chief Complaint  Patient presents with  . Abdominal Pain    HPI Michele Mahoney is a 27 y.o. female.  HPI   27 year old female with left abdominal left flank pain.  Onset about 2 days ago.  Slowly worsening.  Associated nausea and increased urinary frequency.  No dysuria.  No unusual vaginal bleeding or discharge.  Subjective fever.  She has not tried taking anything for her symptoms.  History reviewed. No pertinent past medical history.  There are no active problems to display for this patient.   Past Surgical History:  Procedure Laterality Date  . FRACTURE SURGERY       OB History   None      Home Medications    Prior to Admission medications   Medication Sig Start Date End Date Taking? Authorizing Provider  acetaminophen (TYLENOL) 500 MG tablet Take 1,000 mg by mouth every 6 (six) hours as needed.    [provider]  ciprofloxacin (CIPRO) 500 MG tablet Take 1 tablet (500 mg total) by mouth every 12 (twelve) hours. 10/04/17   Raeford RazorKohut, Julita Ozbun, MD  cyclobenzaprine (FLEXERIL) 5 MG tablet Take 1 tablet (5 mg total) by mouth 3 (three) times daily as needed for muscle spasms. Patient not taking: Reported on 01/27/2017 12/02/16   Burgess AmorIdol, Julie, PA-C  HYDROcodone-acetaminophen (NORCO/VICODIN) 5-325 MG tablet Take 1 tablet by mouth every 4 (four) hours as needed. Patient not taking: Reported on 01/27/2017 11/29/16   Burgess AmorIdol, Julie, PA-C  ibuprofen (ADVIL,MOTRIN) 200 MG tablet Take 200 mg by mouth every 6 (six) hours as needed.    [provider]  medroxyPROGESTERone (DEPO-PROVERA) 150 MG/ML injection Inject 150 mg into the muscle every 3 (three) months.    [provider]  traMADol (ULTRAM) 50 MG tablet Take 1 tablet (50 mg total) by mouth every 6 (six) hours as needed. 01/27/17   Bethann BerkshireZammit, Joseph, MD    Family History No family  history on file.  Social History Social History   Tobacco Use  . Smoking status: Current Every Day Smoker    Packs/day: 0.50    Types: Cigarettes  . Smokeless tobacco: Never Used  Substance Use Topics  . Alcohol use: No  . Drug use: No     Allergies   Amoxicillin; Ceclor [cefaclor]; Penicillins; and Sulfa antibiotics   Review of Systems Review of Systems  All systems reviewed and negative, other than as noted in HPI.   Physical Exam Updated Vital Signs BP 100/69 (BP Location: Right Arm)   Pulse 87   Temp 98.8 F (37.1 C) (Oral)   Resp 16   Ht 5\' 4"  (1.626 m)   Wt 52.2 kg (115 lb)   SpO2 98%   BMI 19.74 kg/m   Physical Exam  Constitutional: She appears well-developed and well-nourished. No distress.  Laying in bed.  No acute distress.  HENT:  Head: Normocephalic and atraumatic.  Eyes: Conjunctivae are normal. Right eye exhibits no discharge. Left eye exhibits no discharge.  Neck: Neck supple.  Cardiovascular: Normal rate, regular rhythm and normal heart sounds. Exam reveals no gallop and no friction rub.  No murmur heard. Pulmonary/Chest: Effort normal and breath sounds normal. No respiratory distress.  Abdominal: Soft. She exhibits no distension. There is no tenderness.  Mild tenderness in the left abdomen and flank without rebound or guarding.  No distention.  Musculoskeletal:  She exhibits no edema or tenderness.  Neurological: She is alert.  Skin: Skin is warm and dry.  Psychiatric: She has a normal mood and affect. Her behavior is normal. Thought content normal.  Nursing note and vitals reviewed.    ED Treatments / Results  Labs (all labs ordered are listed, but only abnormal results are displayed) Labs Reviewed  URINE CULTURE - Abnormal; Notable for the following components:      Result Value   Culture 60,000 COLONIES/mL ESCHERICHIA COLI (*)    Organism ID, Bacteria ESCHERICHIA COLI (*)    All other components within normal limits  COMPREHENSIVE  METABOLIC PANEL - Abnormal; Notable for the following components:   Sodium 133 (*)    Chloride 97 (*)    Total Protein 8.3 (*)    ALT 13 (*)    All other components within normal limits  CBC - Abnormal; Notable for the following components:   RBC 3.81 (*)    Hemoglobin 11.4 (*)    HCT 34.9 (*)    All other components within normal limits  URINALYSIS, ROUTINE W REFLEX MICROSCOPIC - Abnormal; Notable for the following components:   APPearance HAZY (*)    Hgb urine dipstick SMALL (*)    Protein, ur 30 (*)    Nitrite POSITIVE (*)    Leukocytes, UA TRACE (*)    Bacteria, UA MANY (*)    All other components within normal limits  LIPASE, BLOOD  PREGNANCY, URINE    EKG None  Radiology No results found.  Procedures Procedures (including critical care time)  Medications Ordered in ED Medications  acetaminophen (TYLENOL) tablet 650 mg (650 mg Oral Given 10/04/17 1339)  ciprofloxacin (CIPRO) tablet 500 mg (500 mg Oral Given 10/04/17 1750)     Initial Impression / Assessment and Plan / ED Course  I have reviewed the triage vital signs and the nursing notes.  Pertinent labs & imaging results that were available during my care of the patient were reviewed by me and considered in my medical decision making (see chart for details).     L flank pain with UA consistent with UTI. Abx. I feel appropriate for outpt tx.   Final Clinical Impressions(s) / ED Diagnoses   Final diagnoses:  Urinary tract infection without hematuria, site unspecified    ED Discharge Orders        Ordered    ciprofloxacin (CIPRO) 500 MG tablet  Every 12 hours     10/04/17 1745       Raeford Razor, MD 10/07/17 2108

## 2017-10-08 ENCOUNTER — Telehealth: Payer: Self-pay

## 2017-10-08 NOTE — Telephone Encounter (Signed)
Post ED Visit - Positive Culture Follow-up  Culture report reviewed by antimicrobial stewardship pharmacist:  []  Enzo BiNathan Batchelder, Pharm.D. []  Celedonio MiyamotoJeremy Frens, Pharm.D., BCPS AQ-ID [x]  Garvin FilaMike Maccia, Pharm.D., BCPS []  Georgina PillionElizabeth Martin, Pharm.D., BCPS []  CliftonMinh Pham, [place]., BCPS, AAHIVP []  Estella HuskMichelle Turner, Pharm.D., BCPS, AAHIVP []  Lysle Pearlachel Rumbarger, PharmD, BCPS []  Sherlynn CarbonAustin Lucas, PharmD []  Pollyann SamplesAndy Johnston, PharmD, BCPS  Positive urine culture Treated with Ciprofloxacin, organism sensitive to the same and no further patient follow-up is required at this time.  Jerry CarasCullom, Nour Rodrigues Burnett 10/08/2017, 10:01 AM

## 2019-06-04 ENCOUNTER — Emergency Department (HOSPITAL_COMMUNITY): Payer: Self-pay

## 2019-06-04 ENCOUNTER — Emergency Department (HOSPITAL_COMMUNITY)
Admission: EM | Admit: 2019-06-04 | Discharge: 2019-06-05 | Disposition: A | Discharge status: Home / Self Care | Payer: Self-pay | Attending: Emergency Medicine | Admitting: Emergency Medicine

## 2019-06-04 ENCOUNTER — Encounter (HOSPITAL_COMMUNITY): Payer: Self-pay

## 2019-06-04 ENCOUNTER — Other Ambulatory Visit: Payer: Self-pay

## 2019-06-04 DIAGNOSIS — F17210 Nicotine dependence, cigarettes, uncomplicated: Secondary | ICD-10-CM | POA: Insufficient documentation

## 2019-06-04 DIAGNOSIS — F1910 Other psychoactive substance abuse, uncomplicated: Secondary | ICD-10-CM | POA: Insufficient documentation

## 2019-06-04 DIAGNOSIS — L02413 Cutaneous abscess of right upper limb: Secondary | ICD-10-CM | POA: Insufficient documentation

## 2019-06-04 DIAGNOSIS — M79621 Pain in right upper arm: Secondary | ICD-10-CM | POA: Insufficient documentation

## 2019-06-04 DIAGNOSIS — L0291 Cutaneous abscess, unspecified: Secondary | ICD-10-CM

## 2019-06-04 LAB — CBC WITH DIFFERENTIAL/PLATELET
Abs Immature Granulocytes: 0.02 10*3/uL (ref 0.00–0.07)
Basophils Absolute: 0 10*3/uL (ref 0.0–0.1)
Basophils Relative: 0 %
Eosinophils Absolute: 0.1 10*3/uL (ref 0.0–0.5)
Eosinophils Relative: 1 %
HCT: 34.6 % — ABNORMAL LOW (ref 36.0–46.0)
Hemoglobin: 11 g/dL — ABNORMAL LOW (ref 12.0–15.0)
Immature Granulocytes: 0 %
Lymphocytes Relative: 32 %
Lymphs Abs: 3.2 10*3/uL (ref 0.7–4.0)
MCH: 29.1 pg (ref 26.0–34.0)
MCHC: 31.8 g/dL (ref 30.0–36.0)
MCV: 91.5 fL (ref 80.0–100.0)
Monocytes Absolute: 0.7 10*3/uL (ref 0.1–1.0)
Monocytes Relative: 7 %
Neutro Abs: 6.1 10*3/uL (ref 1.7–7.7)
Neutrophils Relative %: 60 %
Platelets: 237 10*3/uL (ref 150–400)
RBC: 3.78 MIL/uL — ABNORMAL LOW (ref 3.87–5.11)
RDW: 13.7 % (ref 11.5–15.5)
WBC: 10.1 10*3/uL (ref 4.0–10.5)
nRBC: 0 % (ref 0.0–0.2)

## 2019-06-04 LAB — BASIC METABOLIC PANEL
Anion gap: 9 (ref 5–15)
BUN: 9 mg/dL (ref 6–20)
CO2: 24 mmol/L (ref 22–32)
Calcium: 9.5 mg/dL (ref 8.9–10.3)
Chloride: 105 mmol/L (ref 98–111)
Creatinine, Ser: 0.47 mg/dL (ref 0.44–1.00)
GFR calc Af Amer: >60 mL/min (ref >60)
GFR calc non Af Amer: >60 mL/min (ref >60)
Glucose, Bld: 90 mg/dL (ref 70–99)
Potassium: 4 mmol/L (ref 3.5–5.1)
Sodium: 138 mmol/L (ref 135–145)

## 2019-06-04 LAB — I-STAT BETA HCG BLOOD, ED (MC, WL, AP ONLY): I-stat hCG, quantitative: <5 m[IU]/mL (ref <5)

## 2019-06-04 MED ORDER — LIDOCAINE HCL (PF) 1 % IJ SOLN
5.0000 mL | Freq: Once | INTRAMUSCULAR | Status: AC
  Administered 2019-06-04: 5 mL
  Filled 2019-06-04: qty 6

## 2019-06-04 MED ORDER — CLINDAMYCIN PHOSPHATE 900 MG/50ML IV SOLN
900.0000 mg | Freq: Once | INTRAVENOUS | Status: AC
  Administered 2019-06-04: 900 mg via INTRAVENOUS
  Filled 2019-06-04: qty 50

## 2019-06-04 MED ORDER — NICOTINE 14 MG/24HR TD PT24
14.0000 mg | MEDICATED_PATCH | Freq: Once | TRANSDERMAL | Status: DC
  Filled 2019-06-04: qty 1

## 2019-06-04 MED ORDER — ACETAMINOPHEN 325 MG PO TABS
650.0000 mg | ORAL_TABLET | Freq: Once | ORAL | Status: AC
  Administered 2019-06-04: 650 mg via ORAL
  Filled 2019-06-04: qty 2

## 2019-06-04 MED ORDER — IOHEXOL 300 MG/ML  SOLN
100.0000 mL | Freq: Once | INTRAMUSCULAR | Status: AC | PRN
  Administered 2019-06-04: 22:00:00 100 mL via INTRAVENOUS

## 2019-06-04 NOTE — ED Notes (Signed)
Pt with difficult IV access. Ultrasound used for IV placement, left arm. One unsuccessful IV attempt left proximal, medial arm. IV established left medial AC via Ultrasound on second attempt. Able to flush without resistance. Pt tolerated procedure fairly.

## 2019-06-04 NOTE — Discharge Instructions (Addendum)
You were given a prescription for antibiotics. Please take the antibiotic prescription fully.   A culture was sent of your blood today to determine if there is any bacterial growth. If the results of the culture are positive and you require an antibiotic or a change of your prescribed antibiotic you will be contacted by the hospital. If the results are negative you will not be contacted.  You were also tested for HIV. These results will be available in approximately 3 days and you will be contacted by the hospital if the results are positive.

## 2019-06-04 NOTE — ED Triage Notes (Addendum)
Pt has a abscess that started draining yesterday on her right upper arm. States it formed 2 days ago. Did not pop abscess, but states it popped on its own and started draining. Deep hole where abscess was.

## 2019-06-04 NOTE — ED Provider Notes (Signed)
[facility] EMERGENCY DEPARTMENT Provider Note   CSN: [id number] Arrival date & time: 06/04/19  1732     History Chief Complaint  Patient presents with   Abscess    Michele Mahoney is a 29 y.o. female.  HPI   Pt is a 29 y/o female with a history of IVDU presents the emergency department today complaining of an abscess to the right upper extremity.  She noticed the abscess about 4 days ago.  She is a warm compress to the area and today it started draining purulent fluid so she came to the ED for further evaluation.  She does have some tenderness surrounding the area of drainage.  She also has several other areas to her bilateral arms concerning for abscesses.  She denies any fevers, chills, sweats or other systemic symptoms.  She denies any chest pain, shortness of breath, cough.   History reviewed. No pertinent past medical history.  There are no problems to display for this patient.   Past Surgical History:  Procedure Laterality Date   FRACTURE SURGERY       OB History   No obstetric history on file.     No family history on file.  Social History   Tobacco Use   Smoking status: Current Every Day Smoker    Packs/day: 0.50    Types: Cigarettes   Smokeless tobacco: Never Used  Substance Use Topics   Alcohol use: No   Drug use: No    Home Medications Prior to Admission medications   Medication Sig Start Date End Date Taking? Authorizing Provider  acetaminophen (TYLENOL) 500 MG tablet Take 1,000 mg by mouth every 6 (six) hours as needed for mild pain or moderate pain.    Yes [provider]  medroxyPROGESTERone (DEPO-PROVERA) 150 MG/ML injection Inject 150 mg into the muscle every 3 (three) months.   Yes [provider]    Allergies    Amoxicillin, Ceclor [cefaclor], Penicillins, and Sulfa antibiotics  Review of Systems   Review of Systems  Constitutional: Negative for chills and fever.  HENT: Negative for ear pain and sore throat.     Eyes: Negative for visual disturbance.  Respiratory: Negative for cough and shortness of breath.   Cardiovascular: Negative for chest pain.  Gastrointestinal: Negative for abdominal pain, constipation, diarrhea, nausea and vomiting.  Genitourinary: Negative for dysuria and hematuria.  Musculoskeletal: Negative for back pain.       Arm pain  Skin: Positive for wound.  Neurological: Negative for weakness and numbness.  All other systems reviewed and are negative.   Physical Exam Updated Vital Signs BP 132/85 (BP Location: Right Arm)    Pulse 99    Temp 98.3 F (36.8 C) (Oral)    Resp 14    Ht 5\' 4"  (1.626 m)    Wt 54.4 kg    SpO2 99%    BMI 20.60 kg/m   Physical Exam Vitals and nursing note reviewed.  Constitutional:      General: She is not in acute distress.    Appearance: She is well-developed.  HENT:     Head: Normocephalic and atraumatic.  Eyes:     Conjunctiva/sclera: Conjunctivae normal.  Cardiovascular:     Rate and Rhythm: Normal rate and regular rhythm.     Pulses: Normal pulses.     Heart sounds: Normal heart sounds. No murmur.  Pulmonary:     Effort: Pulmonary effort is normal. No respiratory distress.     Breath sounds: Normal breath sounds.  No wheezing, rhonchi or rales.  Abdominal:     General: Bowel sounds are normal.     Palpations: Abdomen is soft.     Tenderness: There is no abdominal tenderness.  Musculoskeletal:     Cervical back: Neck supple.     Comments: <0.5cm open wound to the right upper bicep that is actively draining purulent material. There is surrounding induration. There are additional approximately 1cm areas of fluctuance to the bilat AC's. She also has several areas of induration to the right forearm that are nontender and not erythematous.   Skin:    General: Skin is warm and dry.  Neurological:     Mental Status: She is alert.     ED Results / Procedures / Treatments   Labs (all labs ordered are listed, but only abnormal results  are displayed) Labs Reviewed  CBC WITH DIFFERENTIAL/PLATELET - Abnormal; Notable for the following components:      Result Value   RBC 3.78 (*)    Hemoglobin 11.0 (*)    HCT 34.6 (*)    All other components within normal limits  CULTURE, BLOOD (ROUTINE X 2)  BASIC METABOLIC PANEL  HIV ANTIBODY (ROUTINE TESTING W REFLEX)  I-STAT BETA HCG BLOOD, ED ([facility], [facility], [facility] ONLY)    EKG None  Radiology No results found.  Procedures .Marland KitchenIncision and Drainage  Date/Time: 06/04/2019 10:24 PM Performed by: Rodney Booze, PA-C Authorized by: Rodney Booze, PA-C   Consent:    Consent obtained:  Verbal   Consent given by:  Patient   Risks discussed:  Bleeding, incomplete drainage, pain, damage to other organs and infection   Alternatives discussed:  No treatment Location:    Type:  Abscess   Size:  1.5   Location:  Upper extremity   Upper extremity location: left AC. Pre-procedure details:    Skin preparation:  Chloraprep Anesthesia (see MAR for exact dosages):    Anesthesia method:  Local infiltration   Local anesthetic:  Lidocaine 1% w/o epi Procedure type:    Complexity:  Simple Procedure details:    Needle aspiration: no     Incision types:  Stab incision   Incision depth:  Dermal   Scalpel blade:  11   Wound management:  Probed and deloculated   Drainage:  Purulent   Drainage amount:  Scant   Wound treatment:  Wound left open   Packing materials:  None Post-procedure details:    Patient tolerance of procedure:  Tolerated well, no immediate complications   (including critical care time)  Medications Ordered in ED Medications  nicotine (NICODERM CQ - dosed in mg/24 hours) patch 14 mg (14 mg Transdermal Refused 06/04/19 2225)  acetaminophen (TYLENOL) tablet 650 mg (has no administration in time range)  clindamycin (CLEOCIN) IVPB 900 mg (has no administration in time range)  lidocaine (PF) (XYLOCAINE) 1 % injection 5 mL (5 mLs Other Given by Other 06/04/19 2154)  iohexol  (OMNIPAQUE) 300 MG/ML solution 100 mL (100 mLs Intravenous Contrast Given 06/04/19 2226)    ED Course  I have reviewed the triage vital signs and the nursing notes.  Pertinent labs & imaging results that were available during my care of the patient were reviewed by me and considered in my medical decision making (see chart for details).    MDM Rules/Calculators/A&P                      29 year old female with history of IV drug use presenting with  right upper extremity abscess to the right bicep.  It is open and actively draining.  Is been present for several days.  She also has multiple other areas to her arms are concerning for abscess but she states they are nonpainful.  She is afebrile.  She denies any chest pain or shortness of breath.  On arrival she is afebrile with normal vital signs.  Heart with regular rate and rhythm.  No murmurs on exam.  Lungs clear to auscultation bilaterally.  Her arms show multiple areas of induration and fluctuance with a larger area of induration to the right bicep that has a deep opening and is actively draining purulent fluid.  Several of the areas were incised and drained in the ED.  Labs were also obtained as well as blood cultures and HIV testing.  CT of the right upper extremity with contrast was ordered.  Reviewed labs  CBC is without leukocytosis. BMP wnl Blood culture obtained  Beta hcg neg HIV ordered.  At shift change, pending CT of the RUE to r/o deep tissue or muscle involvement. Care transitioned to Felicie Morn, NP with plan to f/u on imaging. If abscess has complicating features would admit for IV abx. If neg would consider d/c with clinda and ER f/u in 48 hours for wound recheck.   Final Clinical Impression(s) / ED Diagnoses Final diagnoses:  Abscess    Rx / DC Orders ED Discharge Orders    None       Rayne Du 06/04/19 2229    Vanetta Mulders, MD 06/11/19 1544

## 2019-06-05 LAB — HIV ANTIBODY (ROUTINE TESTING W REFLEX): HIV Screen 4th Generation wRfx: NONREACTIVE

## 2019-06-05 MED ORDER — CLINDAMYCIN HCL 300 MG PO CAPS: 300.0000 mg | ORAL_CAPSULE | Freq: Four times a day (QID) | ORAL | 0 refills | Status: DC

## 2019-06-05 NOTE — ED Provider Notes (Signed)
Patient received in sign out from C. Couture, PA-C. Patient with multiple abscesses of right upper arm due to IV drug abuse.  Patient is a heroin user. She last injected yesterday, noticed the abscess 4 days ago. Patient has received dose of clindamycin IV. CT results pending.  CT results reviewed and shared with patient. Abscesses appear to involve soft tissues, with no clear involvement of musculature.   Patient had I&D performed by C. Couture. ]  Patient will be discharged home with continuation of clindamycin. Wound check in 48-72 hours.  Referral to peer support. Patient states she has an appointment for drug counseling with a provider in [facility] next Monday.  Care instructions provided and return precautions discussed.     Felicie Morn, NP 06/05/19 [id number]    Vanetta Mulders, MD 06/11/19 [phone]

## 2019-06-09 LAB — CULTURE, BLOOD (ROUTINE X 2)
Culture: NO GROWTH
Special Requests: ADEQUATE

## 2019-06-10 ENCOUNTER — Encounter (HOSPITAL_COMMUNITY): Payer: Self-pay | Admitting: Emergency Medicine

## 2019-06-10 ENCOUNTER — Other Ambulatory Visit: Payer: Self-pay

## 2019-06-10 DIAGNOSIS — F17210 Nicotine dependence, cigarettes, uncomplicated: Secondary | ICD-10-CM | POA: Insufficient documentation

## 2019-06-10 DIAGNOSIS — Z793 Long term (current) use of hormonal contraceptives: Secondary | ICD-10-CM | POA: Insufficient documentation

## 2019-06-10 DIAGNOSIS — F1910 Other psychoactive substance abuse, uncomplicated: Secondary | ICD-10-CM | POA: Insufficient documentation

## 2019-06-10 DIAGNOSIS — L02413 Cutaneous abscess of right upper limb: Secondary | ICD-10-CM | POA: Insufficient documentation

## 2019-06-10 NOTE — ED Triage Notes (Signed)
Pt c/o abscess to the right forearm

## 2019-06-11 ENCOUNTER — Emergency Department (HOSPITAL_COMMUNITY)
Admission: EM | Admit: 2019-06-11 | Discharge: 2019-06-11 | Disposition: A | Discharge status: Home / Self Care | Payer: Medicaid Other | Attending: Emergency Medicine | Admitting: Emergency Medicine

## 2019-06-11 DIAGNOSIS — F1990 Other psychoactive substance use, unspecified, uncomplicated: Secondary | ICD-10-CM

## 2019-06-11 DIAGNOSIS — L02413 Cutaneous abscess of right upper limb: Secondary | ICD-10-CM

## 2019-06-11 MED ORDER — POVIDONE-IODINE 10 % EX SOLN
CUTANEOUS | Status: AC
  Administered 2019-06-11: 2
  Filled 2019-06-11: qty 30

## 2019-06-11 MED ORDER — CLINDAMYCIN HCL 300 MG PO CAPS: 300.0000 mg | ORAL_CAPSULE | Freq: Four times a day (QID) | ORAL | 0 refills | Status: DC

## 2019-06-11 MED ORDER — ACETAMINOPHEN 325 MG PO TABS
650.0000 mg | ORAL_TABLET | Freq: Once | ORAL | Status: AC
  Administered 2019-06-11: 650 mg via ORAL
  Filled 2019-06-11: qty 2

## 2019-06-11 MED ORDER — LIDOCAINE-EPINEPHRINE (PF) 2 %-1:200000 IJ SOLN
10.0000 mL | Freq: Once | INTRAMUSCULAR | Status: AC
  Administered 2019-06-11: 10 mL
  Filled 2019-06-11: qty 10

## 2019-06-11 NOTE — Discharge Instructions (Addendum)
Use heat over the area, that will help with the infection and make it feel better.  Take acetaminophen 650 mg 4 times a day for pain.  Finish the antibiotics you already have and I gave you another 7 days.  In 2 days, Thursday, pull the small short piece of packing out while you are in the shower.  Return to the emergency department if you get a fever, the swelling or pain gets worse, or you get chest pain or shortness of breath.  I gave you some information if you need some outpatient treatment for your drug abuse.

## 2019-06-11 NOTE — ED Provider Notes (Signed)
[facility] EMERGENCY DEPARTMENT Provider Note   CSN: [id number] Arrival date & time: 06/10/19  2209   Time seen 2:30 AM  History Chief Complaint  Patient presents with  . Abscess    Michele Mahoney is a 29 y.o. female.  HPI   Patient states she had a abscess on her right forearm that was just starting when she was seen in the ED on January 26 for an abscess in her right upper arm.  She states it has been getting bigger and more swollen and it got worse today.  She denies any fever, chest pain, or shortness of breath.  She states the last time she injected heroin was prior to her last ED visit.  She states a friend did loan her to Suboxone.  She has been taking her antibiotics and has about 2 days left.  PCP Richardean Chimera, MD   History reviewed. No pertinent past medical history.  There are no problems to display for this patient.   Past Surgical History:  Procedure Laterality Date  . FRACTURE SURGERY       OB History   No obstetric history on file.     No family history on file.  Social History   Tobacco Use  . Smoking status: Current Every Day Smoker    Packs/day: 0.50    Types: Cigarettes  . Smokeless tobacco: Never Used  Substance Use Topics  . Alcohol use: No  . Drug use: No  IV drug use  Home Medications Prior to Admission medications   Medication Sig Start Date End Date Taking? Authorizing Provider  acetaminophen (TYLENOL) 500 MG tablet Take 1,000 mg by mouth every 6 (six) hours as needed for mild pain or moderate pain.     [provider]  clindamycin (CLEOCIN) 300 MG capsule Take 1 capsule (300 mg total) by mouth 4 (four) times daily. X 7 days 06/11/19   Devoria Albe, MD  medroxyPROGESTERone (DEPO-PROVERA) 150 MG/ML injection Inject 150 mg into the muscle every 3 (three) months.    [provider]    Allergies    Amoxicillin, Ceclor [cefaclor], Penicillins, and Sulfa antibiotics  Review of Systems   Review of Systems  All other  systems reviewed and are negative.   Physical Exam Updated Vital Signs BP (!) 146/82 (BP Location: Right Arm)   Pulse (!) 116   Temp 98.7 F (37.1 C)   Resp 18   Ht 5\' 4"  (1.626 m)   Wt 54.4 kg   SpO2 100%   BMI 20.60 kg/m   Physical Exam Vitals and nursing note reviewed.  Constitutional:      General: She is not in acute distress.    Appearance: Normal appearance.  HENT:     Head: Normocephalic and atraumatic.     Right Ear: External ear normal.     Left Ear: External ear normal.  Eyes:     Extraocular Movements: Extraocular movements intact.     Pupils: Pupils are equal, round, and reactive to light.  Cardiovascular:     Rate and Rhythm: Normal rate and regular rhythm.     Heart sounds: No murmur. No friction rub.  Pulmonary:     Effort: Pulmonary effort is normal. No respiratory distress.     Breath sounds: Normal breath sounds.  Musculoskeletal:        General: Normal range of motion.     Cervical back: Normal range of motion.  Skin:    General: Skin is warm and  dry.     Findings: Erythema present.     Comments: Patient has a large abscess on her ulnar aspect of the lateral dorsum of her right forearm.  Please see photos.  She also has a well-healed area in her right upper arm from the abscess she was seen for on January 26.  Neurological:     General: No focal deficit present.     Mental Status: She is alert and oriented to person, place, and time.     Cranial Nerves: No cranial nerve deficit.  Psychiatric:        Mood and Affect: Mood normal.        Behavior: Behavior normal.        Thought Content: Thought content normal.      Right Upper Arm   Right Forearm   Right Forearm     ED Results / Procedures / Treatments   Labs (all labs ordered are listed, but only abnormal results are displayed) Labs Reviewed - No data to display  EKG None  Radiology No results found.  Procedures .Marland KitchenIncision and Drainage  Date/Time: 06/11/2019 3:20  AM Performed by: Rolland Porter, MD Authorized by: Rolland Porter, MD   Consent:    Consent obtained:  Verbal   Consent given by:  Patient Location:    Type:  Abscess   Location:  Upper extremity   Upper extremity location:  Arm   Arm location:  R lower arm Pre-procedure details:    Procedure prep: Betadine/saline mix. Anesthesia (see MAR for exact dosages):    Anesthesia method:  Local infiltration   Local anesthetic:  Lidocaine 2% WITH epi Procedure type:    Complexity:  Complex Procedure details:    Incision types:  Stab incision   Incision depth:  Subcutaneous   Scalpel blade:  11   Wound management:  Probed and deloculated   Drainage:  Purulent   Drainage amount:  Copious   Wound treatment:  Drain placed   Packing materials:  1/4 in gauze   Amount 1/4":  Less then 3/4 cm Post-procedure details:    Patient tolerance of procedure:  Tolerated well, no immediate complications Comments:     With the first incision a stream of purulent material was released.   (including critical care time)  Medications Ordered in ED Medications  lidocaine-EPINEPHrine (XYLOCAINE W/EPI) 2 %-1:200000 (PF) injection 10 mL (10 mLs Infiltration Given 06/11/19 0259)  povidone-iodine (BETADINE) 10 % external solution (2 application  Given 05/15/99 0259)  acetaminophen (TYLENOL) tablet 650 mg (650 mg Oral Given 06/11/19 0932)    ED Course  I have reviewed the triage vital signs and the nursing notes.  Pertinent labs & imaging results that were available during my care of the patient were reviewed by me and considered in my medical decision making (see chart for details).    MDM Rules/Calculators/A&P                      Patient's abscess had incision and drainage done, she was given acetaminophen for pain per her request.  We discussed pulling out the drain in 2 days while in the shower.  When I listen to her heart I do not hear any murmur, we did discuss risk of endocarditis with the IV drug use.  She  states she is wanting to stop.  Final Clinical Impression(s) / ED Diagnoses Final diagnoses:  Abscess of right forearm  IVDU (intravenous drug user)    Rx / DC  Orders ED Discharge Orders         Ordered    clindamycin (CLEOCIN) 300 MG capsule  4 times daily     06/11/19 0316          Plan discharge  Devoria Albe, MD, Concha Pyo, MD 06/11/19 [phone]

## 2019-09-21 ENCOUNTER — Other Ambulatory Visit: Payer: Self-pay

## 2019-09-21 ENCOUNTER — Emergency Department (HOSPITAL_COMMUNITY)
Admission: EM | Admit: 2019-09-21 | Discharge: 2019-09-21 | Disposition: A | Discharge status: Home / Self Care | Payer: Medicaid Other | Attending: Emergency Medicine | Admitting: Emergency Medicine

## 2019-09-21 ENCOUNTER — Encounter (HOSPITAL_COMMUNITY): Payer: Self-pay | Admitting: Emergency Medicine

## 2019-09-21 DIAGNOSIS — S46912A Strain of unspecified muscle, fascia and tendon at shoulder and upper arm level, left arm, initial encounter: Secondary | ICD-10-CM | POA: Insufficient documentation

## 2019-09-21 DIAGNOSIS — Y92410 Unspecified street and highway as the place of occurrence of the external cause: Secondary | ICD-10-CM | POA: Insufficient documentation

## 2019-09-21 DIAGNOSIS — Y9389 Activity, other specified: Secondary | ICD-10-CM | POA: Insufficient documentation

## 2019-09-21 DIAGNOSIS — Z79899 Other long term (current) drug therapy: Secondary | ICD-10-CM | POA: Insufficient documentation

## 2019-09-21 DIAGNOSIS — S66912A Strain of unspecified muscle, fascia and tendon at wrist and hand level, left hand, initial encounter: Secondary | ICD-10-CM | POA: Insufficient documentation

## 2019-09-21 DIAGNOSIS — Y999 Unspecified external cause status: Secondary | ICD-10-CM | POA: Insufficient documentation

## 2019-09-21 DIAGNOSIS — F17210 Nicotine dependence, cigarettes, uncomplicated: Secondary | ICD-10-CM | POA: Insufficient documentation

## 2019-09-21 MED ORDER — NAPROXEN 375 MG PO TABS: 375.0000 mg | ORAL_TABLET | Freq: Two times a day (BID) | ORAL | 0 refills | Status: DC

## 2019-09-21 NOTE — Discharge Instructions (Signed)
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
## 2021-05-17 ENCOUNTER — Emergency Department (HOSPITAL_COMMUNITY): Payer: No Typology Code available for payment source

## 2021-05-17 ENCOUNTER — Encounter (HOSPITAL_COMMUNITY): Payer: Self-pay

## 2021-05-17 ENCOUNTER — Emergency Department (HOSPITAL_COMMUNITY)
Admission: EM | Admit: 2021-05-17 | Discharge: 2021-05-17 | Disposition: A | Discharge status: Home / Self Care | Payer: No Typology Code available for payment source | Attending: Emergency Medicine | Admitting: Emergency Medicine

## 2021-05-17 DIAGNOSIS — L03114 Cellulitis of left upper limb: Secondary | ICD-10-CM | POA: Diagnosis not present

## 2021-05-17 DIAGNOSIS — L03113 Cellulitis of right upper limb: Secondary | ICD-10-CM | POA: Insufficient documentation

## 2021-05-17 DIAGNOSIS — Y92410 Unspecified street and highway as the place of occurrence of the external cause: Secondary | ICD-10-CM | POA: Insufficient documentation

## 2021-05-17 DIAGNOSIS — R42 Dizziness and giddiness: Secondary | ICD-10-CM | POA: Insufficient documentation

## 2021-05-17 DIAGNOSIS — R519 Headache, unspecified: Secondary | ICD-10-CM | POA: Diagnosis present

## 2021-05-17 DIAGNOSIS — L03119 Cellulitis of unspecified part of limb: Secondary | ICD-10-CM

## 2021-05-17 MED ORDER — CLINDAMYCIN HCL 150 MG PO CAPS: 450.0000 mg | ORAL_CAPSULE | Freq: Three times a day (TID) | ORAL | 0 refills | Status: AC
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
## 2021-08-24 ENCOUNTER — Emergency Department (HOSPITAL_COMMUNITY): Payer: Medicaid Other

## 2021-08-24 ENCOUNTER — Encounter (HOSPITAL_COMMUNITY): Payer: Self-pay

## 2021-08-24 ENCOUNTER — Inpatient Hospital Stay (HOSPITAL_COMMUNITY)
Admission: EM | Admit: 2021-08-24 | Discharge: 2021-08-27 | DRG: 872 | Discharge status: Against Medical Advice | Payer: Self-pay | Attending: Internal Medicine | Admitting: Internal Medicine

## 2021-08-24 ENCOUNTER — Other Ambulatory Visit: Payer: Self-pay

## 2021-08-24 DIAGNOSIS — Z8249 Family history of ischemic heart disease and other diseases of the circulatory system: Secondary | ICD-10-CM

## 2021-08-24 DIAGNOSIS — A400 Sepsis due to streptococcus, group A: Principal | ICD-10-CM | POA: Diagnosis present

## 2021-08-24 DIAGNOSIS — Z28310 Unvaccinated for covid-19: Secondary | ICD-10-CM

## 2021-08-24 DIAGNOSIS — L02414 Cutaneous abscess of left upper limb: Secondary | ICD-10-CM | POA: Diagnosis present

## 2021-08-24 DIAGNOSIS — F1110 Opioid abuse, uncomplicated: Secondary | ICD-10-CM | POA: Diagnosis present

## 2021-08-24 DIAGNOSIS — S51842A Puncture wound with foreign body of left forearm, initial encounter: Secondary | ICD-10-CM | POA: Diagnosis present

## 2021-08-24 DIAGNOSIS — L089 Local infection of the skin and subcutaneous tissue, unspecified: Secondary | ICD-10-CM

## 2021-08-24 DIAGNOSIS — E876 Hypokalemia: Secondary | ICD-10-CM | POA: Diagnosis present

## 2021-08-24 DIAGNOSIS — F1990 Other psychoactive substance use, unspecified, uncomplicated: Secondary | ICD-10-CM

## 2021-08-24 DIAGNOSIS — L02413 Cutaneous abscess of right upper limb: Secondary | ICD-10-CM | POA: Diagnosis present

## 2021-08-24 DIAGNOSIS — L02419 Cutaneous abscess of limb, unspecified: Secondary | ICD-10-CM

## 2021-08-24 DIAGNOSIS — W460XXA Contact with hypodermic needle, initial encounter: Secondary | ICD-10-CM | POA: Diagnosis present

## 2021-08-24 DIAGNOSIS — Z833 Family history of diabetes mellitus: Secondary | ICD-10-CM

## 2021-08-24 DIAGNOSIS — R7881 Bacteremia: Secondary | ICD-10-CM

## 2021-08-24 DIAGNOSIS — Z882 Allergy status to sulfonamides status: Secondary | ICD-10-CM

## 2021-08-24 DIAGNOSIS — E872 Acidosis, unspecified: Secondary | ICD-10-CM | POA: Diagnosis present

## 2021-08-24 DIAGNOSIS — B955 Unspecified streptococcus as the cause of diseases classified elsewhere: Secondary | ICD-10-CM

## 2021-08-24 DIAGNOSIS — E875 Hyperkalemia: Secondary | ICD-10-CM | POA: Diagnosis not present

## 2021-08-24 DIAGNOSIS — B950 Streptococcus, group A, as the cause of diseases classified elsewhere: Secondary | ICD-10-CM

## 2021-08-24 DIAGNOSIS — Z5329 Procedure and treatment not carried out because of patient's decision for other reasons: Secondary | ICD-10-CM | POA: Diagnosis not present

## 2021-08-24 DIAGNOSIS — F17210 Nicotine dependence, cigarettes, uncomplicated: Secondary | ICD-10-CM | POA: Diagnosis present

## 2021-08-24 LAB — URINALYSIS, ROUTINE W REFLEX MICROSCOPIC
Bilirubin Urine: NEGATIVE
Glucose, UA: NEGATIVE mg/dL
Ketones, ur: 5 mg/dL — AB
Nitrite: NEGATIVE
Protein, ur: 30 mg/dL — AB
Specific Gravity, Urine: >1.046 — ABNORMAL HIGH (ref 1.005–1.030)
pH: 6 (ref 5.0–8.0)

## 2021-08-24 LAB — PROTIME-INR
INR: 1.5 — ABNORMAL HIGH (ref 0.8–1.2)
Prothrombin Time: 18.3 seconds — ABNORMAL HIGH (ref 11.4–15.2)

## 2021-08-24 LAB — APTT: aPTT: 39 seconds — ABNORMAL HIGH (ref 24–36)

## 2021-08-24 LAB — BASIC METABOLIC PANEL
Anion gap: 16 — ABNORMAL HIGH (ref 5–15)
BUN: 12 mg/dL (ref 6–20)
CO2: 21 mmol/L — ABNORMAL LOW (ref 22–32)
Calcium: 9.2 mg/dL (ref 8.9–10.3)
Chloride: 95 mmol/L — ABNORMAL LOW (ref 98–111)
Creatinine, Ser: 0.74 mg/dL (ref 0.44–1.00)
GFR, Estimated: >60 mL/min (ref >60)
Glucose, Bld: 106 mg/dL — ABNORMAL HIGH (ref 70–99)
Potassium: 2.9 mmol/L — ABNORMAL LOW (ref 3.5–5.1)
Sodium: 132 mmol/L — ABNORMAL LOW (ref 135–145)

## 2021-08-24 LAB — CBC
HCT: 36.5 % (ref 36.0–46.0)
Hemoglobin: 12.3 g/dL (ref 12.0–15.0)
MCH: 29.6 pg (ref 26.0–34.0)
MCHC: 33.7 g/dL (ref 30.0–36.0)
MCV: 88 fL (ref 80.0–100.0)
Platelets: 384 10*3/uL (ref 150–400)
RBC: 4.15 MIL/uL (ref 3.87–5.11)
RDW: 13.7 % (ref 11.5–15.5)
WBC: 21.3 10*3/uL — ABNORMAL HIGH (ref 4.0–10.5)
nRBC: 0.3 % — ABNORMAL HIGH (ref 0.0–0.2)

## 2021-08-24 LAB — LACTIC ACID, PLASMA
Lactic Acid, Venous: 3.6 mmol/L (ref 0.5–1.9)
Lactic Acid, Venous: 3 mmol/L (ref 0.5–1.9)

## 2021-08-24 LAB — MAGNESIUM: Magnesium: 2.2 mg/dL (ref 1.7–2.4)

## 2021-08-24 LAB — PHOSPHORUS: Phosphorus: 2.7 mg/dL (ref 2.5–4.6)

## 2021-08-24 LAB — HIV ANTIBODY (ROUTINE TESTING W REFLEX): HIV Screen 4th Generation wRfx: NONREACTIVE

## 2021-08-24 MED ORDER — FENTANYL CITRATE PF 50 MCG/ML IJ SOSY
50.0000 ug | PREFILLED_SYRINGE | INTRAMUSCULAR | Status: DC | PRN
  Administered 2021-08-24 – 2021-08-27 (×16): 50 ug via INTRAVENOUS
  Filled 2021-08-24 (×17): qty 1

## 2021-08-24 MED ORDER — ACETAMINOPHEN 325 MG PO TABS
650.0000 mg | ORAL_TABLET | Freq: Four times a day (QID) | ORAL | Status: DC | PRN
  Administered 2021-08-24: 650 mg via ORAL
  Filled 2021-08-24: qty 2

## 2021-08-24 MED ORDER — BUPRENORPHINE HCL-NALOXONE HCL 8-2 MG SL SUBL
1.0000 | SUBLINGUAL_TABLET | Freq: Two times a day (BID) | SUBLINGUAL | Status: DC
  Filled 2021-08-24: qty 1

## 2021-08-24 MED ORDER — ACETAMINOPHEN 650 MG RE SUPP: 650.0000 mg | Freq: Four times a day (QID) | RECTAL | Status: DC | PRN

## 2021-08-24 MED ORDER — ONDANSETRON HCL 4 MG/2ML IJ SOLN: 4.0000 mg | Freq: Four times a day (QID) | INTRAMUSCULAR | Status: DC | PRN

## 2021-08-24 MED ORDER — SODIUM CHLORIDE 0.9 % IV BOLUS
1500.0000 mL | Freq: Once | INTRAVENOUS | Status: AC
  Administered 2021-08-24: 1500 mL via INTRAVENOUS

## 2021-08-24 MED ORDER — SODIUM CHLORIDE 0.9 % IV SOLN: INTRAVENOUS | Status: DC

## 2021-08-24 MED ORDER — VANCOMYCIN HCL 750 MG/150ML IV SOLN
750.0000 mg | Freq: Two times a day (BID) | INTRAVENOUS | Status: DC
  Administered 2021-08-25: 750 mg via INTRAVENOUS
  Filled 2021-08-24 (×3): qty 150

## 2021-08-24 MED ORDER — SODIUM CHLORIDE 0.9 % IV SOLN
2.0000 g | Freq: Once | INTRAVENOUS | Status: AC
  Administered 2021-08-24: 2 g via INTRAVENOUS
  Filled 2021-08-24: qty 10

## 2021-08-24 MED ORDER — MAGNESIUM SULFATE 2 GM/50ML IV SOLN
2.0000 g | Freq: Once | INTRAVENOUS | Status: AC
  Administered 2021-08-24: 2 g via INTRAVENOUS
  Filled 2021-08-24: qty 50

## 2021-08-24 MED ORDER — KETOROLAC TROMETHAMINE 30 MG/ML IJ SOLN
30.0000 mg | Freq: Three times a day (TID) | INTRAMUSCULAR | Status: DC | PRN
  Administered 2021-08-24 – 2021-08-25 (×2): 30 mg via INTRAVENOUS
  Filled 2021-08-24 (×2): qty 1

## 2021-08-24 MED ORDER — HEPARIN SODIUM (PORCINE) 5000 UNIT/ML IJ SOLN
5000.0000 [IU] | Freq: Three times a day (TID) | INTRAMUSCULAR | Status: DC
  Administered 2021-08-24 – 2021-08-27 (×9): 5000 [IU] via SUBCUTANEOUS
  Filled 2021-08-24 (×9): qty 1

## 2021-08-24 MED ORDER — VANCOMYCIN HCL IN DEXTROSE 1-5 GM/200ML-% IV SOLN
1000.0000 mg | Freq: Once | INTRAVENOUS | Status: AC
  Administered 2021-08-24: 1000 mg via INTRAVENOUS
  Filled 2021-08-24: qty 200

## 2021-08-24 MED ORDER — ONDANSETRON HCL 4 MG PO TABS: 4.0000 mg | ORAL_TABLET | Freq: Four times a day (QID) | ORAL | Status: DC | PRN

## 2021-08-24 MED ORDER — IOHEXOL 300 MG/ML  SOLN
100.0000 mL | Freq: Once | INTRAMUSCULAR | Status: AC | PRN
  Administered 2021-08-24: 75 mL via INTRAVENOUS

## 2021-08-24 MED ORDER — SODIUM CHLORIDE 0.9 % IV SOLN: INTRAVENOUS | Status: AC

## 2021-08-24 MED ORDER — PNEUMOCOCCAL 20-VAL CONJ VACC 0.5 ML IM SUSY: 0.5000 mL | PREFILLED_SYRINGE | INTRAMUSCULAR | Status: DC

## 2021-08-24 MED ORDER — SODIUM CHLORIDE 0.9 % IV SOLN
2.0000 g | Freq: Three times a day (TID) | INTRAVENOUS | Status: DC
  Administered 2021-08-24 – 2021-08-25 (×2): 2 g via INTRAVENOUS
  Filled 2021-08-24 (×6): qty 10

## 2021-08-24 MED ORDER — POTASSIUM CHLORIDE CRYS ER 20 MEQ PO TBCR
40.0000 meq | EXTENDED_RELEASE_TABLET | ORAL | Status: AC
  Administered 2021-08-24 (×3): 40 meq via ORAL
  Filled 2021-08-24 (×3): qty 2

## 2021-08-24 MED ORDER — PANTOPRAZOLE SODIUM 40 MG PO TBEC
40.0000 mg | DELAYED_RELEASE_TABLET | Freq: Every day | ORAL | Status: DC
  Administered 2021-08-24 – 2021-08-27 (×4): 40 mg via ORAL
  Filled 2021-08-24 (×4): qty 1

## 2021-08-24 MED ORDER — LACTATED RINGERS IV BOLUS
1000.0000 mL | Freq: Once | INTRAVENOUS | Status: AC
  Administered 2021-08-24: 1000 mL via INTRAVENOUS

## 2021-08-24 MED ORDER — BUPRENORPHINE HCL-NALOXONE HCL 2-0.5 MG SL SUBL
1.0000 | SUBLINGUAL_TABLET | SUBLINGUAL | Status: AC | PRN
  Administered 2021-08-25: 1 via SUBLINGUAL
  Filled 2021-08-24: qty 1

## 2021-08-24 NOTE — ED Triage Notes (Signed)
Patient complaining of bilateral hand swelling. States she is an IV drug user and her injection sites are infected.  ?

## 2021-08-24 NOTE — Sepsis Progress Note (Signed)
Notified bedside nurse of need to draw lactic acid and blood cultures.  

## 2021-08-24 NOTE — Progress Notes (Signed)
Pharmacy Antibiotic Note ? ?Michele Mahoney is a 31 y.o. female admitted on 08/24/2021 with sepsis.  Pharmacy has been consulted for Aztreonam and Vancomycin dosing. ? ?Plan: ?Aztreonam 2mg  IV q 8h ?Vancomycin 750mg  IV q 12h ?F/u renal function, cultures, and clinical progress. ? ? ?Height: 5\' 4"  (162.6 cm) ?Weight: 54.4 kg (120 lb) ?IBW/kg (Calculated) : 54.7 ? ?Temp (24hrs), Avg:98.2 ?F (36.8 ?C), Min:98.2 ?F (36.8 ?C), Max:98.2 ?F (36.8 ?C) ? ?Recent Labs  ?Lab 08/24/21 ?1032  ?WBC 21.3*  ?CREATININE 0.74  ?  ?Estimated Creatinine Clearance: 87.5 mL/min (by C-G formula based on SCr of 0.74 mg/dL).   ? ?Allergies  ?Allergen Reactions  ? Amoxicillin Rash  ? Ceclor [Cefaclor] Rash  ? Penicillins Rash  ?  Has patient had a PCN reaction causing immediate rash, facial/tongue/throat swelling, SOB or lightheadedness with hypotension: Yes ?Has patient had a PCN reaction causing severe rash involving mucus membranes or skin necrosis: No ?Has patient had a PCN reaction that required hospitalization No ?Has patient had a PCN reaction occurring within the last 10 years: No ?If all of the above answers are "NO", then may proceed with Cephalosporin use. ?  ? Sulfa Antibiotics Rash  ? ? ?Microbiology results: ?Blood cx 4/18 ? ?Thank you for allowing pharmacy to be a part of this patient?s care. ? ? , PharmD ?Clinical Pharmacist ?08/24/2021 1:59 PM ? ? ? ? ? ? ?

## 2021-08-24 NOTE — Sepsis Progress Note (Signed)
Notified provider of need to order fluid bolus.  ?

## 2021-08-24 NOTE — Assessment & Plan Note (Signed)
-   In the setting of acute ongoing infection from bilateral forearm abscesses/cellulitis. ?-Continue aggressive fluid resuscitation and follow lactic acid trend. ?

## 2021-08-24 NOTE — Assessment & Plan Note (Signed)
-  In the setting of decreased oral intake ?-Provide fluid resuscitation ?-Replete electrolytes and follow trend ?-Telemetry monitoring until electrolytes has reached normal limits. ?-Magnesium within normal limits. ?

## 2021-08-24 NOTE — H&P (Signed)
?History and Physical  ? ? ?Patient: Michele Mahoney VOZ:[id number] DOB: September 04, 1990 ?DOA: 08/24/2021 ?DOS: the patient was seen and examined on 08/24/2021 ?PCP: Caryl Bis, MD  ?Patient coming from: Home ? ?Chief Complaint:  ?Chief Complaint  ?Patient presents with  ? Hand Pain  ? ?HPI: Michele Mahoney is a 31 y.o. female with medical history significant of IV drug use and gastroesophageal reflux disease; who presented to the hospital secondary to pain, swelling and active drainage from multiple wounds affecting her forearms bilaterally.  Patient reports that the areas in questions are usually the site that she use to inject recreational drugs.  For approximately a week ago she has been noticing excoriation, with associated erythematous changes and active drainage.  She had no use any injectable substances in the last 7 days.  Patient reports that in the last 48 hours had noticed development of increased pain and swelling.  Patient reports no chest pain, no shortness of breath, no fevers (but expressed chills), no dysuria, no hematuria, no melena, no hematochezia, no focal weaknesses or any other complaints. ? ?In the past she had developed some superficial infection in similar affected areas as the 1 that she had noticed today and has just been treated with oral antibiotics. ? ?Patient is not vaccinated against COVID; she is allergic to penicillins. ? ?In the ED work-up demonstrated patient meeting sepsis criteria with tachycardia, elevated lactic acid, elevated WBCs and positive source of infection (bilateral forearm abscesses/cellulitis).  Aggressive fluid resuscitation as per sepsis protocol was initiated, cultures taken, patient started on broad-spectrum antibiotics as recommended by orthopedic service who was consulted into the case (Dr. Amedeo Kinsman).   ? ?Review of Systems: As mentioned in the history of present illness. All other systems reviewed and are negative. ? ?History reviewed. No pertinent past medical  history. ?Past Surgical History:  ?Procedure Laterality Date  ? FRACTURE SURGERY    ? ?Social History:  reports that she has been smoking cigarettes. She has been smoking an average of .5 packs per day. She has never used smokeless tobacco. She reports current drug use. Drug: IV. She reports that she does not drink alcohol. ? ?Allergies  ?Allergen Reactions  ? Amoxicillin Rash  ? Ceclor [Cefaclor] Rash  ? Penicillins Rash  ?  Has patient had a PCN reaction causing immediate rash, facial/tongue/throat swelling, SOB or lightheadedness with hypotension: Yes ?Has patient had a PCN reaction causing severe rash involving mucus membranes or skin necrosis: No ?Has patient had a PCN reaction that required hospitalization No ?Has patient had a PCN reaction occurring within the last 10 years: No ?If all of the above answers are "NO", then may proceed with Cephalosporin use. ?  ? Sulfa Antibiotics Rash  ? ? ?Family History  ?Problem Relation Age of Onset  ? Hypertension Mother   ? Diabetes Mother   ? Diabetes Other   ? Hypertension Other   ? ? ?Prior to Admission medications   ?Medication Sig Start Date End Date Taking? Authorizing Provider  ?acetaminophen (TYLENOL) 500 MG tablet Take 1,000 mg by mouth every 6 (six) hours as needed for mild pain or moderate pain.    Yes [provider]  ?naproxen (NAPROSYN) 375 MG tablet Take 1 tablet (375 mg total) by mouth 2 (two) times daily. ?Patient not taking: Reported on 08/24/2021 09/21/19   Margarita Mail, PA-C  ? ? ?Physical Exam: ?Vitals:  ? 08/24/21 1028 08/24/21 1032 08/24/21 1416 08/24/21 1636  ?BP:  127/65 122/70 118/71  ?Pulse:  91 96 87  ?Resp:  _0 ?Temp:  98.2 ?F (36.8 ?C)  98.2 ?F (36.8 ?C)  ?TempSrc:  Oral  Oral  ?SpO2:  97% 99% 98%  ?Weight: 54.4 kg     ?Height: _1  (1.626 m)     ? ?General exam: Alert, awake, oriented x 3; chronically ill in appearance, with very poor dental hygiene/missing teeth.  In mild to moderate distress secondary to ongoing pain in  her bilateral forearms. ?Respiratory system: Clear to auscultation. Respiratory effort normal.  No using accessory muscles; good saturation on room air. ?Cardiovascular system: Sinus rhythm, no rubs, no gallops, no JVD. ?Gastrointestinal system: Abdomen is nondistended, soft and nontender. No organomegaly or masses felt. Normal bowel sounds heard. ?Central nervous system: Alert and oriented. No focal neurological deficits. ?Extremities/skin: No positive ulceration/excoriation affecting both arms with active purulent discharges.  There is significant swelling, erythema and tenderness.  Please refer to images taken by EDP and also myself for further illustration.  (Epic media) ?Psychiatry: Judgement and insight appear normal. Mood & affect appropriate.  ? ?Data Reviewed: ?CBC demonstrating a WBC of 21,000, hemoglobin 12.3 platelet count 384K ?Basic metabolic panel with a sodium of 132, potassium 2.9, chloride 95, BUN 12 and creatinine 0.74 ?Lactic acid on arrival 3.6 >> 3 nature fluid resuscitation down to 3.0 ?HIV antibody nonreactive ?Phosphorus 2.7 ?Magnesium 2.2 ? ?Assessment and Plan: ?* Abscess of forearm ?-With component of bilateral forearm cellulitic process ?-Patient with underlying history of IV drug use ?-Will check CRP, ESR and follow recommendation by orthopedic service ?-Patient is allergic to penicillin and has been started on vancomycin and aztreonam. ?-Continue aggressive fluid resuscitation, anti-inflammatory medications and analgesic therapy. ? ?Hypokalemia ?-In the setting of decreased oral intake ?-Provide fluid resuscitation ?-Replete electrolytes and follow trend ?-Telemetry monitoring until electrolytes has reached normal limits. ?-Magnesium within normal limits. ? ?IVDU (intravenous drug user) ?-Cessation counseling has been provided ?-TOC has been made aware to provide outpatient resources to assist patient quitting. ?-Suboxone for opiates withdrawal prevention initiated (last time used over  7 days ago) ?-Follow clinical response and provide supportive care. ?-COWS score 9 ? ?Lactic acidosis ?- In the setting of acute ongoing infection from bilateral forearm abscesses/cellulitis. ?-Continue aggressive fluid resuscitation and follow lactic acid trend. ? ? ?Advance Care Planning:   Code Status: Full Code  ? ?Consults: Orthopedic service ? ?Family Communication: No family at bedside. ? ?Severity of Illness: ?The appropriate patient status for this patient is INPATIENT. Inpatient status is judged to be reasonable and necessary in order to provide the required intensity of service to ensure the patient's safety. The patient's presenting symptoms, physical exam findings, and initial radiographic and laboratory data in the context of their chronic comorbidities is felt to place them at high risk for further clinical deterioration. Furthermore, it is not anticipated that the patient will be medically stable for discharge from the hospital within 2 midnights of admission.  ? ?* I certify that at the point of admission it is my clinical judgment that the patient will require inpatient hospital care spanning beyond 2 midnights from the point of admission due to high intensity of service, high risk for further deterioration and high frequency of surveillance required.* ? ?Author: ?Barton Dubois, MD ?08/24/2021 6:51 PM ? ?For on call review [email or web address].  ?

## 2021-08-24 NOTE — Assessment & Plan Note (Signed)
-  With component of bilateral forearm cellulitic process ?-Patient with underlying history of IV drug use ?-Will check CRP, ESR and follow recommendation by orthopedic service ?-Patient is allergic to penicillin and has been started on vancomycin and aztreonam. ?-Continue aggressive fluid resuscitation, anti-inflammatory medications and analgesic therapy. ?

## 2021-08-24 NOTE — ED Notes (Signed)
Pt hard stick, IV access by ultrasound. One IV obtained and One set of cultures obtained.  ?

## 2021-08-24 NOTE — ED Notes (Signed)
Pt at CT

## 2021-08-24 NOTE — ED Notes (Signed)
Pt complaining of bilateral hand pain  ?

## 2021-08-24 NOTE — Assessment & Plan Note (Addendum)
-  Cessation counseling has been provided ?-TOC has been made aware to provide outpatient resources to assist patient quitting. ?-Suboxone for opiates withdrawal prevention initiated (last time used over 7 days ago) ?-Follow clinical response and provide supportive care. ?-COWS score 9 ?

## 2021-08-24 NOTE — Sepsis Progress Note (Signed)
ELink tracking the code sepsis.  

## 2021-08-24 NOTE — ED Provider Notes (Signed)
?[facility] ?Provider Note ? ? ?CSN: [id number] ?Arrival date & time: 08/24/21  1017 ? ?  ? ?History ? ?Chief Complaint  ?Patient presents with  ? Hand Pain  ? ? ?Michele Mahoney is a 31 y.o. female. ? ?HPI ? ?  ?Patient comes in with chief complaint of hand swelling. ? ?Patient admits to history of IV drug use.  She indicates that she started having wound excoriation in the left forearm about a week ago.  Subsequently, she noted similar excoriation on the right forearm.  2 or 3 days later she started having wound excoriation over her left wrist, and subsequently drainage from the forearm wound. ? ?Review of systems negative for fevers or chills.  Patient has had increased pain and she has noted swelling extending into her hand over the last couple of days, prompting her to come to the ER. ? ?She later states that long time ago she might have broke a needle in her L forearm.  Patient has numbness over her digit 4 and 5 in left hand. ? ?Home Medications ?Prior to Admission medications   ?Medication Sig Start Date End Date Taking? Authorizing Provider  ?acetaminophen (TYLENOL) 500 MG tablet Take 1,000 mg by mouth every 6 (six) hours as needed for mild pain or moderate pain.    Yes [provider]  ?naproxen (NAPROSYN) 375 MG tablet Take 1 tablet (375 mg total) by mouth 2 (two) times daily. ?Patient not taking: Reported on 08/24/2021 09/21/19   Arthor Captain, PA-C  ?   ? ?Allergies    ?Amoxicillin, Ceclor [cefaclor], Penicillins, and Sulfa antibiotics   ? ?Review of Systems   ?Review of Systems  ?All other systems reviewed and are negative. ? ?Physical Exam ?Updated Vital Signs ?BP 122/70   Pulse 96   Temp 98.2 ?F (36.8 ?C) (Oral)   Resp 19   Ht 5\' 4"  (1.626 m)   Wt 54.4 kg   SpO2 99%   BMI 20.60 kg/m?  ?Physical Exam ?Vitals and nursing note reviewed.  ?Constitutional:   ?   Appearance: She is well-developed.  ?HENT:  ?   Head: Atraumatic.  ?Cardiovascular:  ?   Rate and Rhythm: Normal  rate.  ?Pulmonary:  ?   Effort: Pulmonary effort is normal.  ?Musculoskeletal:     ?   General: Swelling and tenderness present.  ?   Cervical back: Normal range of motion and neck supple.  ?   Comments: Excoriated ulcers over the bilateral proximal and distal forearm, including the wrist.  There is purulent drainage from the proximal left and right antecubital fossa wound. ? ?Patient has swelling over both of her hands. Pt's hand is in slight flexion, with all digit enlarged.  No tenderness over the flexor tendon sheath.  Positive tenderness with passive extension of the digits  ?Skin: ?   General: Skin is warm and dry.  ?   Findings: Erythema and rash present.  ?Neurological:  ?   Mental Status: She is alert and oriented to person, place, and time.  ? ? ? ? ? ? ? ? ? ? ? ? ? ? ?ED Results / Procedures / Treatments   ?Labs ?(all labs ordered are listed, but only abnormal results are displayed) ?Labs Reviewed  ?CBC - Abnormal; Notable for the following components:  ?    Result Value  ? WBC 21.3 (*)   ? nRBC 0.3 (*)   ? All other components within normal limits  ?BASIC METABOLIC PANEL -  Abnormal; Notable for the following components:  ? Sodium 132 (*)   ? Potassium 2.9 (*)   ? Chloride 95 (*)   ? CO2 21 (*)   ? Glucose, Bld 106 (*)   ? Anion gap 16 (*)   ? All other components within normal limits  ?LACTIC ACID, PLASMA - Abnormal; Notable for the following components:  ? Lactic Acid, Venous 3.6 (*)   ? All other components within normal limits  ?PROTIME-INR - Abnormal; Notable for the following components:  ? Prothrombin Time 18.3 (*)   ? INR 1.5 (*)   ? All other components within normal limits  ?APTT - Abnormal; Notable for the following components:  ? aPTT 39 (*)   ? All other components within normal limits  ?CULTURE, BLOOD (ROUTINE X 2)  ?CULTURE, BLOOD (ROUTINE X 2)  ?AEROBIC CULTURE W GRAM STAIN (SUPERFICIAL SPECIMEN)  ?LACTIC ACID, PLASMA  ?URINALYSIS, ROUTINE W REFLEX MICROSCOPIC  ?HIV ANTIBODY (ROUTINE  TESTING W REFLEX)  ?PHOSPHORUS  ?MAGNESIUM  ?POC URINE PREG, ED  ? ? ?EKG ?None ? ?Radiology ?DG Forearm Left ? ?Result Date: 08/24/2021 ?CLINICAL DATA:  History of IV drug abuse. Bilateral hand and arm swelling. EXAM: LEFT FOREARM - 2 VIEW COMPARISON:  Wrist radiographs 05/17/2021. FINDINGS: No evidence of acute fracture, dislocation or bone destruction. No significant elbow joint effusion identified. There is diffuse soft tissue swelling with areas of skin ulceration and/or soft tissue emphysema dorsally at the wrist and in the radial aspect of the antecubital fossa. There is a small linear metallic foreign body within the radial aspect of the antecubital fossa. IMPRESSION: 1. Soft tissue swelling with areas of soft tissue emphysema and/or skin ulceration as described, suspicious for soft tissue infection. 2. Linear metallic foreign body within the soft tissues of the antecubital fossa. 3. No evidence of osteomyelitis or septic arthritis. Electronically Signed   By: Carey Bullocks M.D.   On: 08/24/2021 14:00  ? ?DG Forearm Right ? ?Result Date: 08/24/2021 ?CLINICAL DATA:  IV drug abuser with bilateral hand and arm swelling. Evaluate for gas. EXAM: RIGHT FOREARM - 2 VIEW COMPARISON:  Wrist radiographs 05/17/2021.  CT 06/04/2019. FINDINGS: No evidence of acute fracture, dislocation or bone destruction. No evidence of large elbow joint effusion. There is diffuse soft tissue swelling in the forearm with areas of skin ulceration and/or soft tissue emphysema in the radial aspect of the elbow and the radial aspect of the wrist. Two linear metallic foreign bodies are present within the radial soft tissues, at least one of which was present previously. IMPRESSION: 1. Diffuse soft tissue swelling with areas of soft tissue emphysema and/or skin ulceration as described. Findings suspicious for soft tissue infection. 2. Linear metallic foreign bodies within the soft tissues of the mid forearm, at least one of which was present  previously. 3. No evidence of osteomyelitis. Electronically Signed   By: Carey Bullocks M.D.   On: 08/24/2021 13:58   ? ?Procedures ?Marland KitchenCritical Care ?Performed by: Derwood Kaplan, MD ?Authorized by: Derwood Kaplan, MD  ? ?Critical care provider statement:  ?  Critical care time (minutes):  42 ?  Critical care was necessary to treat or prevent imminent or life-threatening deterioration of the following conditions:  Sepsis ?  Critical care was time spent personally by me on the following activities:  Development of treatment plan with patient or surrogate, discussions with consultants, evaluation of patient's response to treatment, examination of patient, ordering and review of laboratory studies, ordering and review of  radiographic studies, ordering and performing treatments and interventions, pulse oximetry, re-evaluation of patient's condition and review of old charts  ? ? ?Medications Ordered in ED ?Medications  ?0.9 %  sodium chloride infusion ( Intravenous New Bag/Given 08/24/21 1405)  ?vancomycin (VANCOCIN) IVPB 1000 mg/200 mL premix (1,000 mg Intravenous New Bag/Given 08/24/21 1511)  ?vancomycin (VANCOREADY) IVPB 750 mg/150 mL (has no administration in time range)  ?aztreonam (AZACTAM) 2 g in sodium chloride 0.9 % 100 mL IVPB (has no administration in time range)  ?heparin injection 5,000 Units (5,000 Units Subcutaneous Given 08/24/21 1511)  ?lactated ringers bolus 1,000 mL (has no administration in time range)  ?ketorolac (TORADOL) 30 MG/ML injection 30 mg (has no administration in time range)  ?fentaNYL (SUBLIMAZE) injection 50 mcg (has no administration in time range)  ?potassium chloride SA (KLOR-CON M) CR tablet 40 mEq (has no administration in time range)  ?magnesium sulfate IVPB 2 g 50 mL (has no administration in time range)  ?acetaminophen (TYLENOL) tablet 650 mg (has no administration in time range)  ?  Or  ?acetaminophen (TYLENOL) suppository 650 mg (has no administration in time range)  ?ondansetron  (ZOFRAN) tablet 4 mg (has no administration in time range)  ?  Or  ?ondansetron (ZOFRAN) injection 4 mg (has no administration in time range)  ?sodium chloride 0.9 % bolus 1,500 mL (has no administration in ti

## 2021-08-25 DIAGNOSIS — R7881 Bacteremia: Secondary | ICD-10-CM

## 2021-08-25 DIAGNOSIS — L089 Local infection of the skin and subcutaneous tissue, unspecified: Principal | ICD-10-CM

## 2021-08-25 DIAGNOSIS — B955 Unspecified streptococcus as the cause of diseases classified elsewhere: Secondary | ICD-10-CM

## 2021-08-25 DIAGNOSIS — B950 Streptococcus, group A, as the cause of diseases classified elsewhere: Secondary | ICD-10-CM

## 2021-08-25 LAB — BASIC METABOLIC PANEL
Anion gap: 7 (ref 5–15)
BUN: 10 mg/dL (ref 6–20)
CO2: 17 mmol/L — ABNORMAL LOW (ref 22–32)
Calcium: 7.6 mg/dL — ABNORMAL LOW (ref 8.9–10.3)
Chloride: 108 mmol/L (ref 98–111)
Creatinine, Ser: 0.52 mg/dL (ref 0.44–1.00)
GFR, Estimated: >60 mL/min (ref >60)
Glucose, Bld: 98 mg/dL (ref 70–99)
Potassium: 5.2 mmol/L — ABNORMAL HIGH (ref 3.5–5.1)
Sodium: 132 mmol/L — ABNORMAL LOW (ref 135–145)

## 2021-08-25 LAB — BLOOD CULTURE ID PANEL (REFLEXED) - BCID2

## 2021-08-25 LAB — CBC
HCT: 30.2 % — ABNORMAL LOW (ref 36.0–46.0)
Hemoglobin: 9.6 g/dL — ABNORMAL LOW (ref 12.0–15.0)
MCH: 30 pg (ref 26.0–34.0)
MCHC: 31.8 g/dL (ref 30.0–36.0)
MCV: 94.4 fL (ref 80.0–100.0)
Platelets: 200 10*3/uL (ref 150–400)
RBC: 3.2 MIL/uL — ABNORMAL LOW (ref 3.87–5.11)
RDW: 14.6 % (ref 11.5–15.5)
WBC: 17.9 10*3/uL — ABNORMAL HIGH (ref 4.0–10.5)
nRBC: 0.7 % — ABNORMAL HIGH (ref 0.0–0.2)

## 2021-08-25 MED ORDER — LINEZOLID 600 MG PO TABS
600.0000 mg | ORAL_TABLET | Freq: Two times a day (BID) | ORAL | Status: DC
  Administered 2021-08-25 – 2021-08-27 (×5): 600 mg via ORAL
  Filled 2021-08-25 (×6): qty 1

## 2021-08-25 MED ORDER — EPINEPHRINE 0.3 MG/0.3ML IJ SOAJ
0.3000 mg | Freq: Once | INTRAMUSCULAR | Status: DC | PRN
  Filled 2021-08-25: qty 0.6
  Filled 2021-08-25: qty 0.3

## 2021-08-25 MED ORDER — PENICILLIN G POTASSIUM 20000000 UNITS IJ SOLR
12.0000 10*6.[IU] | Freq: Two times a day (BID) | INTRAVENOUS | Status: DC
  Administered 2021-08-25 – 2021-08-27 (×4): 12 10*6.[IU] via INTRAVENOUS
  Filled 2021-08-25 (×7): qty 12

## 2021-08-25 MED ORDER — AMOXICILLIN 250 MG PO CAPS
500.0000 mg | ORAL_CAPSULE | Freq: Once | ORAL | Status: AC
  Administered 2021-08-25: 500 mg via ORAL
  Filled 2021-08-25: qty 2

## 2021-08-25 MED ORDER — DIPHENHYDRAMINE HCL 50 MG/ML IJ SOLN
25.0000 mg | Freq: Once | INTRAMUSCULAR | Status: DC | PRN
  Filled 2021-08-25: qty 1

## 2021-08-25 NOTE — TOC Initial Note (Signed)
Transition of Care (TOC) - Initial/Assessment Note  ? ? ?Patient Details  ?Name: Michele Mahoney ?MRN: [id number] ?Date of Birth: Jan 27, 1991 ? ?Transition of Care (TOC) CM/SW Contact:    ?Aleeyah Bensen D, LCSW ?Phone Number: ?08/25/2021, 3:43 PM ? ?Clinical Narrative:                 ?TOC consulted for SA resources. Patient agreeable to resources. Patient provided multiple SA resources and advised to contact TOC if she needed assistance with initiating services.  ?Patient has not previously been in SA treatment services. She is aware of [facility] that provides opoid treatment however she wants a facility closer to her home.  ? ?Expected Discharge Plan: Home/Self Care ?Barriers to Discharge: Continued Medical Work up ? ? ?Patient Goals and CMS Choice ?  ?  ?  ? ?Expected Discharge Plan and Services ?Expected Discharge Plan: Home/Self Care ?  ?  ?  ?Living arrangements for the past 2 months: Single Family Home ?                ?  ?  ?  ?  ?  ?  ?  ?  ?  ?  ? ?Prior Living Arrangements/Services ?Living arrangements for the past 2 months: Single Family Home ?  ?  ?       ?  ?  ?  ?  ? ?Activities of Daily Living ?Home Assistive Devices/Equipment: None ?ADL Screening (condition at time of admission) ?Patient's cognitive ability adequate to safely complete daily activities?: Yes ?Is the patient deaf or have difficulty hearing?: No ?Does the patient have difficulty seeing, even when wearing glasses/contacts?: No ?Does the patient have difficulty concentrating, remembering, or making decisions?: No ?Patient able to express need for assistance with ADLs?: No ?Does the patient have difficulty dressing or bathing?: No ?Independently performs ADLs?: Yes (appropriate for developmental age) ?Does the patient have difficulty walking or climbing stairs?: No ?Weakness of Legs: None ?Weakness of Arms/Hands: Both ? ?Permission Sought/Granted ?  ?  ?   ?   ?   ?   ? ?Emotional Assessment ?  ?  ?  ?  ?  ?  ? ?Admission diagnosis:  Abscess  of forearm [L02.419] ?Soft tissue infection [L08.9] ?Patient Active Problem List  ? Diagnosis Date Noted  ? Soft tissue infection   ? Group A streptococcal infection   ? Bacteremia   ? Abscess of forearm 08/24/2021  ? Lactic acidosis 08/24/2021  ? IVDU (intravenous drug user) 08/24/2021  ? Hypokalemia 08/24/2021  ? ?PCP:  Richardean Chimera, MD ?Pharmacy:   ?[facility] - [place], [place] - [id number] ?[address] ?[place] [address] ?Phone: [phone] Fax: [phone] ? ?[facility] - [place], [place] - [address] ?[address] ?[place] [address] ?Phone: [phone] Fax: [phone] ? ? ? ? ?Social Determinants of Health (SDOH) Interventions ?  ? ?Readmission Risk Interventions ?   ? View : No data to display.  ?  ?  ?  ? ? ? ?

## 2021-08-25 NOTE — Progress Notes (Addendum)
PCN oral challenge completed with no adverse reactions noted. Start time 1140 with initial vitals documented in flowsheet. End time 1240. Patient has no complaints of sob, no new rash, no itching, no new swelling. Neither Benadryl or epi pen needed. Primary nurse, Ander Purpura, LPN notified. Educated patient on notifying nurse if any symptoms appear after this.  ? ?Patient sitting on side of bed attempting to eat lunch now. ?

## 2021-08-25 NOTE — Progress Notes (Signed)
Patient removed IV access by accident.,  ?

## 2021-08-25 NOTE — Plan of Care (Signed)

## 2021-08-25 NOTE — Progress Notes (Signed)
PHARMACY - PHYSICIAN COMMUNICATION ?CRITICAL VALUE ALERT - BLOOD CULTURE IDENTIFICATION (BCID) ? ?Michele Mahoney is an 31 y.o. female who presented to [facility] on 08/24/2021 with a chief complaint of bilateral arm swelling ? ?Assessment:  Strep Pyogenes Group A in all 4 blood cultures and abscess. Patient is undergoing PCN oral challenge to guide further streamlining of Abx ? ?Name of physician (or Provider) Contacted: Dr. Avon Gully ? ?Current antibiotics: Vanomycin and Aztreonam ? ?Changes to prescribed antibiotics recommended:  ?Await PCN oral challenge ?If successful, begin IV Penicillin G and discontinue Vanc and Aztreonam ? ?Results for orders placed or performed during the hospital encounter of 08/24/21  ?Blood Culture ID Panel (Reflexed) (Collected: 08/24/2021  2:00 PM)  ?Result Value Ref Range  ? Enterococcus faecalis NOT DETECTED NOT DETECTED  ? Enterococcus Faecium NOT DETECTED NOT DETECTED  ? Listeria monocytogenes NOT DETECTED NOT DETECTED  ? Staphylococcus species NOT DETECTED NOT DETECTED  ? Staphylococcus aureus (BCID) NOT DETECTED NOT DETECTED  ? Staphylococcus epidermidis NOT DETECTED NOT DETECTED  ? Staphylococcus lugdunensis NOT DETECTED NOT DETECTED  ? Streptococcus species DETECTED (A) NOT DETECTED  ? Streptococcus agalactiae NOT DETECTED NOT DETECTED  ? Streptococcus pneumoniae NOT DETECTED NOT DETECTED  ? Streptococcus pyogenes DETECTED (A) NOT DETECTED  ? A.calcoaceticus-baumannii NOT DETECTED NOT DETECTED  ? Bacteroides fragilis NOT DETECTED NOT DETECTED  ? Enterobacterales NOT DETECTED NOT DETECTED  ? Enterobacter cloacae complex NOT DETECTED NOT DETECTED  ? Escherichia coli NOT DETECTED NOT DETECTED  ? Klebsiella aerogenes NOT DETECTED NOT DETECTED  ? Klebsiella oxytoca NOT DETECTED NOT DETECTED  ? Klebsiella pneumoniae NOT DETECTED NOT DETECTED  ? Proteus species NOT DETECTED NOT DETECTED  ? Salmonella species NOT DETECTED NOT DETECTED  ? Serratia marcescens NOT DETECTED NOT DETECTED  ?  Haemophilus influenzae NOT DETECTED NOT DETECTED  ? Neisseria meningitidis NOT DETECTED NOT DETECTED  ? Pseudomonas aeruginosa NOT DETECTED NOT DETECTED  ? Stenotrophomonas maltophilia NOT DETECTED NOT DETECTED  ? Candida albicans NOT DETECTED NOT DETECTED  ? Candida auris NOT DETECTED NOT DETECTED  ? Candida glabrata NOT DETECTED NOT DETECTED  ? Candida krusei NOT DETECTED NOT DETECTED  ? Candida parapsilosis NOT DETECTED NOT DETECTED  ? Candida tropicalis NOT DETECTED NOT DETECTED  ? Cryptococcus neoformans/gattii NOT DETECTED NOT DETECTED  ? ? ?Lorenso Courier, PharmD ?Clinical Pharmacist ?08/25/2021 11:55 AM ? ? ? ? ? ? ? ?

## 2021-08-25 NOTE — Consult Note (Signed)
?   ? ? ? ? ?[facility]   ? ?Date of Admission:  08/24/2021    ? ?Reason for Consult: group a strep bacteremia    ?Referring Provider: Cranston Neighbor ? ? ?Abx: ?4/19-c pcn g ?4/19-c linezolid ? ?4/18-19 aztreonam ?4/18-19 vanc      ? ? ?Assessment: ?31 yo female with ivdu/skin popping, chronic open wound bilateral UE admitted with sepsis found to have bilateral UE soft tissue infection along with group a strep bacteremia ? ?Ct showed left anticubital fossa with broken needle retained ? ?High burden initial bacteremia. While GAS doesn't usually cause endocarditis, would try to rule this out. Also will do concomittant linezolid along with a beta-lactam for [place]'s effect ? ?She tolerated amoxicillin oral challenge; feels comfortable she doesn't have a true pcn allergy or had grown out of it ? ?Orthopedic had evaluated patient and doesn't feel there is evidence of nec-fasc ? ?Plan: ?Start linezolid and pcn g ?Repeat blood cx ?Check tte ?Once blood cx sterilize and soft tissue infection stable/improving could dc linezolid ?If persistent bacteremia and negative w/u for endocarditis, would consider I&D of upper ext left at least where the needle is present ?Discussed with primary team ? ? ?I spent 40 minute reviewing data/chart, and coordinating care providing/discuss diagnostics/treatment plan with primary team ?  ? ? ? ? ?------------------------------------------------ ?Principal Problem: ?  Abscess of forearm ?Active Problems: ?  Lactic acidosis ?  IVDU (intravenous drug user) ?  Hypokalemia ? ? ? ?HPI: Michele Mahoney is a 31 y.o. female ivdu admitted with bilateral upper ext chronic open wound with increased pain/drainage.  ? ?This is a virtual note ?History via chart/sign out ?She reported a week of worsening bilateral upper wound changes. Last skin popping/ivdu 7 day prior to admission ? ?No fever but subjective chill ?No focal joint pain, numbness, tingling, headache, visual  change ? ?Has hx pcn allergy but tolerated oral amox challenge ?Started initially on vanc/aztreonam ?Initial presentation with lactic acidosis, leukocytosis, sinus tach but no fever ?Ct scan bilateral ue without abscess  ?Ortho evaluated and no concern for nec fasc at this time ? ?Blood cx came back 2 of 2 set with group a strep ? ?Her labs are improving with empiric abx ? ? ? ?Family History  ?Problem Relation Age of Onset  ? Hypertension Mother   ? Diabetes Mother   ? Diabetes Other   ? Hypertension Other   ? ? ?Social History  ? ?Tobacco Use  ? Smoking status: Every Day  ?  Packs/day: 0.50  ?  Types: Cigarettes  ? Smokeless tobacco: Never  ?Vaping Use  ? Vaping Use: Never used  ?Substance Use Topics  ? Alcohol use: No  ? Drug use: Yes  ?  Types: IV  ? ? ?Allergies  ?Allergen Reactions  ? Sulfa Antibiotics Rash  ? ? ?Review of Systems: ?ROS ?All Other ROS was negative, except mentioned above ? ? ?History reviewed. No pertinent past medical history. ? ? ? ? ?Scheduled Meds: ? buprenorphine-naloxone  1 tablet Sublingual BID  ? heparin injection (subcutaneous)  5,000 Units Subcutaneous Q8H  ? linezolid  600 mg Oral Q12H  ? pantoprazole  40 mg Oral Daily  ? pneumococcal 20-valent conjugate vaccine  0.5 mL Intramuscular Tomorrow-1000  ? ?Continuous Infusions: ? sodium chloride 100 mL/hr at 08/25/21 1505  ? penicillin g continuous IV infusion    ? ?PRN Meds:.acetaminophen **OR** acetaminophen, buprenorphine-naloxone, diphenhydrAMINE, EPINEPHrine, fentaNYL (SUBLIMAZE) injection, ketorolac, ondansetron **OR** ondansetron (  ZOFRAN) IV ? ? ?OBJECTIVE: ?Blood pressure 110/61, pulse (!) 116, temperature 98.8 ?F (37.1 ?C), temperature source Oral, resp. rate 18, height 5\' 4"  (1.626 m), weight 54.4 kg, SpO2 100 %. ? ?Physical Exam ? ?Virtual note ?Reviewed pictures of bilateral UE ? ?Lab Results ?Lab Results  ?Component Value Date  ? WBC 17.9 (H) 08/25/2021  ? HGB 9.6 (L) 08/25/2021  ? HCT 30.2 (L) 08/25/2021  ? MCV 94.4  08/25/2021  ? PLT 200 08/25/2021  ?  ?Lab Results  ?Component Value Date  ? CREATININE 0.52 08/25/2021  ? BUN 10 08/25/2021  ? NA 132 (L) 08/25/2021  ? K 5.2 (H) 08/25/2021  ? CL 108 08/25/2021  ? CO2 17 (L) 08/25/2021  ?  ?Lab Results  ?Component Value Date  ? ALT 13 (L) 10/04/2017  ? AST 19 10/04/2017  ? ALKPHOS 59 10/04/2017  ? BILITOT 0.6 10/04/2017  ?  ? ? ?Microbiology: ?Recent Results (from the past 240 hour(s))  ?Blood Culture (routine x 2)     Status: None (Preliminary result)  ? Collection Time: 08/24/21  1:24 PM  ? Specimen: BLOOD  ?Result Value Ref Range Status  ? Specimen Description   Final  ?  BLOOD BLOOD RIGHT ARM ?Performed at [facility], [address], [place], [place] ?  ? Special Requests   Final  ?  BOTTLES DRAWN AEROBIC AND ANAEROBIC Blood Culture adequate volume ?Performed at [facility], [address], [place], [place] ?  ? Culture  Setup Time   Final  ?  GRAM POSITIVE COCCI ANAEROBIC AND AEROBIC BOTTLES Gram Stain Report Called to,Read Back By and Verified With: ONDARA,M@0545  BY MATTHEWS, B 4.19.2023 ?CRITICAL VALUE NOTED.  VALUE IS CONSISTENT WITH PREVIOUSLY REPORTED AND CALLED VALUE. ?Performed at [facility], [address], [place], [place] ?  ? Culture GRAM POSITIVE COCCI IN CHAINS  Final  ? Report Status PENDING  Incomplete  ?Aerobic Culture w Gram Stain (superficial specimen)     Status: None (Preliminary result)  ? Collection Time: 08/24/21  1:25 PM  ? Specimen: Abscess  ?Result Value Ref Range Status  ? Specimen Description   Final  ?  ABSCESS LEFT ARM ?Performed at [facility], [address], [place], [place] ?  ? Special Requests   Final  ?  NONE ?Performed at [facility], [address] [place], [place] ?  ? Gram Stain   Final  ?  MODERATE WBC PRESENT,BOTH PMN AND MONONUCLEAR ?FEW GRAM POSITIVE COCCI IN PAIRS AND CHAINS ?  ? Culture   Final  ?  ABUNDANT GROUP A STREP (S.PYOGENES) ISOLATED ?Beta hemolytic  streptococci are predictably susceptible to penicillin and other beta lactams. Susceptibility testing not routinely performed. ?Performed at [facility], [address], [place], [place] ?  ? Report Status PENDING  Incomplete  ?Blood Culture (routine x 2)     Status: None (Preliminary result)  ? Collection Time: 08/24/21  2:00 PM  ? Specimen: BLOOD  ?Result Value Ref Range Status  ? Specimen Description   Final  ?  BLOOD BLOOD RIGHT ARM ?Performed at [facility], [address] [place], [place] ?  ? Special Requests   Final  ?  BOTTLES DRAWN AEROBIC AND ANAEROBIC Blood Culture adequate volume ?Performed at [facility], [address], [place], [place] ?  ? Culture  Setup Time   Final  ?  GRAM POSITIVE  COCCI ANAEROBIC AND AEROBIC BOTTLES Gram Stain Report Called to,Read Back By and Verified With: ONDARA,M@0545  BY MATTHEWS, B 4.19.203 ?CRITICAL RESULT CALLED TO, READ BACK BY AND VERIFIED WITH: PHARMD G COFFEE [id number] AT 1134 BY CM ?Performed at [facility], [address] [place], [place] [id number] ?  ? Culture GRAM POSITIVE COCCI IN CHAINS  Final  ? Report Status PENDING  Incomplete  ?Blood Culture ID Panel (Reflexed)     Status: Abnormal  ? Collection Time: 08/24/21  2:00 PM  ?Result Value Ref Range Status  ? Enterococcus faecalis NOT DETECTED NOT DETECTED Final  ? Enterococcus Faecium NOT DETECTED NOT DETECTED Final  ? Listeria monocytogenes NOT DETECTED NOT DETECTED Final  ? Staphylococcus species NOT DETECTED NOT DETECTED Final  ? Staphylococcus aureus (BCID) NOT DETECTED NOT DETECTED Final  ? Staphylococcus epidermidis NOT DETECTED NOT DETECTED Final  ? Staphylococcus lugdunensis NOT DETECTED NOT DETECTED Final  ? Streptococcus species DETECTED (A) NOT DETECTED Final  ?  Comment: CRITICAL RESULT CALLED TO, READ BACK BY AND VERIFIED WITH: ?PHARMD G COFFEE [id number] AT 1135 BY CM ?  ? Streptococcus agalactiae NOT DETECTED NOT DETECTED Final  ? Streptococcus pneumoniae NOT  DETECTED NOT DETECTED Final  ? Streptococcus pyogenes DETECTED (A) NOT DETECTED Final  ?  Comment: CRITICAL RESULT CALLED TO, READ BACK BY AND VERIFIED WITH: ?PHARMD G COFFEE [id number] AT 1135 BY CM ?  ? A.calcoacetic

## 2021-08-25 NOTE — Consult Note (Signed)
WOC Nurse Consult Note: ?Reason for Consult: Multiple full thickness lesions to bilateral upper extremities associated with IV drug use. Patient reports that there may be a broken needle in the left wrist. This is confirmed by xray. Orthopedics has been consulted.  ?Wound type:trauma, chemical, foreign body ?Pressure Injury POA: N/A ?Measurement: See photos provided by EDP  ?Wound bed: red, dry with dried serum ?Drainage (amount, consistency, odor) scant serous to serosanguinous ?Periwound: erythematous, edematous, discolored ?Dressing procedure/placement/frequency: I will provide conservative guidance for topical care of the lesions using a soap and water cleanse followed by rinsing and patting dry. This is to followed by placement of folded layers of antimicrobial nonadherent gauze (xeroform) over the lesions and covering with dry gauze and topped with an ABD pads. The dressings are to be secured with Kerlix roll gauze/paper tape. ? ?Any orders provided by a physician will supercede these. ? ?WOC nursing team will not follow, but will remain available to this patient, the nursing and medical teams.  Please re-consult if needed. ?Thanks, ?Ladona Mow, MSN, RN, GNP, CWOCN, CWON-AP, FAAN  ?Pager# [phone]  ? ? ? ?  ? ? ? ?  ?

## 2021-08-25 NOTE — Consult Note (Signed)
? ?ORTHOPAEDIC CONSULTATION ? ?REQUESTING PHYSICIAN: Little Ishikawa, MD ? ?ASSESSMENT AND PLAN: ?31 y.o. female with the following: Bilateral upper extremity swelling, and ulcerations.   ? ?Orthopedics recommends admission to a medical service and we will provide consultation and follow along ? ?Patient has bilateral upper extremity ulcerations and excoriations, with diffuse swelling.  CT scanning of the left forearm and hand demonstrates diffuse swelling, without drainable fluid collection.  There is some fluid within the flexor tendon sheaths at the wrist.  Recommend continued broad-spectrum IV antibiotics, and we will continue to monitor the response.  No plans for surgery at this time.  She may ultimately require a hand specialist, based on her current presentation.  We will monitor closely. ? ?Local wound care and IV antibiotics ? ?- Weight Bearing Status/Activity: No restrictions ? ?- Additional recommended labs/tests: Recommend monitoring ESR and CRP for response to antibiotic treatment. ? ?-VTE Prophylaxis: As needed ? ?- Pain control: As needed ? ?- Follow-up plan: To be determined ? ?-Procedures: None planned currently.  Recommend local wound care. ? ?Chief Complaint: Bilateral upper extremity swelling ? ?HPI: ?Michele Mahoney is a 31 y.o. female with bilateral forearm and hand swelling.  She has a long history of IV drug abuse.  She injects in both arms.  She has had issues with abscesses in the past.  She last injected approximate 1 week ago.  Over the last 1-2 weeks, she has developed progressive worsening swelling, drainage and some scabbing over both upper extremities.  The left is worse than the right.  She has pain in both of her hands as a result.  She denies having fevers or chills.  Due to the progressive nature of the wounds, including drainage, she presented for evaluation.  She states she last used heroin about 7 days ago. ? ?History reviewed. No pertinent past medical history. ?Past  Surgical History:  ?Procedure Laterality Date  ? FRACTURE SURGERY    ? ?Social History  ? ?Socioeconomic History  ? Marital status: Single  ?  Spouse name: Not on file  ? Number of children: Not on file  ? Years of education: Not on file  ? Highest education level: Not on file  ?Occupational History  ? Not on file  ?Tobacco Use  ? Smoking status: Every Day  ?  Packs/day: 0.50  ?  Types: Cigarettes  ? Smokeless tobacco: Never  ?Vaping Use  ? Vaping Use: Never used  ?Substance and Sexual Activity  ? Alcohol use: No  ? Drug use: Yes  ?  Types: IV  ? Sexual activity: Yes  ?  Birth control/protection: Injection  ?Other Topics Concern  ? Not on file  ?Social History Narrative  ? Not on file  ? ?Social Determinants of Health  ? ?Financial Resource Strain: Not on file  ?Food Insecurity: Not on file  ?Transportation Needs: Not on file  ?Physical Activity: Not on file  ?Stress: Not on file  ?Social Connections: Not on file  ? ?Family History  ?Problem Relation Age of Onset  ? Hypertension Mother   ? Diabetes Mother   ? Diabetes Other   ? Hypertension Other   ? ?Allergies  ?Allergen Reactions  ? Amoxicillin Rash  ? Ceclor [Cefaclor] Rash  ? Penicillins Rash  ?  Has patient had a PCN reaction causing immediate rash, facial/tongue/throat swelling, SOB or lightheadedness with hypotension: Yes ?Has patient had a PCN reaction causing severe rash involving mucus membranes or skin necrosis: No ?Has patient had a  PCN reaction that required hospitalization No ?Has patient had a PCN reaction occurring within the last 10 years: No ?If all of the above answers are "NO", then may proceed with Cephalosporin use. ?  ? Sulfa Antibiotics Rash  ? ?Prior to Admission medications   ?Medication Sig Start Date End Date Taking? Authorizing Provider  ?acetaminophen (TYLENOL) 500 MG tablet Take 1,000 mg by mouth every 6 (six) hours as needed for mild pain or moderate pain.    Yes [provider]  ?naproxen (NAPROSYN) 375 MG tablet Take 1  tablet (375 mg total) by mouth 2 (two) times daily. ?Patient not taking: Reported on 08/24/2021 09/21/19   Margarita Mail, PA-C  ? ?DG Forearm Left ? ?Result Date: 08/24/2021 ?CLINICAL DATA:  History of IV drug abuse. Bilateral hand and arm swelling. EXAM: LEFT FOREARM - 2 VIEW COMPARISON:  Wrist radiographs 05/17/2021. FINDINGS: No evidence of acute fracture, dislocation or bone destruction. No significant elbow joint effusion identified. There is diffuse soft tissue swelling with areas of skin ulceration and/or soft tissue emphysema dorsally at the wrist and in the radial aspect of the antecubital fossa. There is a small linear metallic foreign body within the radial aspect of the antecubital fossa. IMPRESSION: 1. Soft tissue swelling with areas of soft tissue emphysema and/or skin ulceration as described, suspicious for soft tissue infection. 2. Linear metallic foreign body within the soft tissues of the antecubital fossa. 3. No evidence of osteomyelitis or septic arthritis. Electronically Signed   By: Richardean Sale M.D.   On: 08/24/2021 14:00  ? ?DG Forearm Right ? ?Result Date: 08/24/2021 ?CLINICAL DATA:  IV drug abuser with bilateral hand and arm swelling. Evaluate for gas. EXAM: RIGHT FOREARM - 2 VIEW COMPARISON:  Wrist radiographs 05/17/2021.  CT 06/04/2019. FINDINGS: No evidence of acute fracture, dislocation or bone destruction. No evidence of large elbow joint effusion. There is diffuse soft tissue swelling in the forearm with areas of skin ulceration and/or soft tissue emphysema in the radial aspect of the elbow and the radial aspect of the wrist. Two linear metallic foreign bodies are present within the radial soft tissues, at least one of which was present previously. IMPRESSION: 1. Diffuse soft tissue swelling with areas of soft tissue emphysema and/or skin ulceration as described. Findings suspicious for soft tissue infection. 2. Linear metallic foreign bodies within the soft tissues of the mid  forearm, at least one of which was present previously. 3. No evidence of osteomyelitis. Electronically Signed   By: Richardean Sale M.D.   On: 08/24/2021 13:58  ? ?CT FOREARM LEFT W CONTRAST ? ?Result Date: 08/24/2021 ?CLINICAL DATA:  Left forearm and hand abscesses.  IV drug user. EXAM: CT OF THE UPPER LEFT EXTREMITY WITH CONTRAST TECHNIQUE: Multidetector CT imaging of the left hand and forearm was performed according to the standard protocol following intravenous contrast administration. RADIATION DOSE REDUCTION: This exam was performed according to the departmental dose-optimization program which includes automated exposure control, adjustment of the mA and/or kV according to patient size and/or use of iterative reconstruction technique. CONTRAST:  25mL OMNIPAQUE IOHEXOL 300 MG/ML  SOLN COMPARISON:  Left forearm x-rays from same day. Left wrist x-rays dated May 17, 2021. FINDINGS: Bones/Joint/Cartilage No fracture or dislocation. Joint spaces are preserved. No joint effusion. Ligaments Ligaments are suboptimally evaluated by CT. Muscles and Tendons Flexor tendon sheath fluid at the wrist proximal to the carpal tunnel and extending into the first, fourth, and fifth flexor tendon sheaths in the palmar hand (series 10,  images 45 and 76). Flexor and extensor tendons are grossly intact. No muscle atrophy. Soft tissue Subcutaneous 6 mm linear radiopaque density in the antecubital fossa approximately 4 mm deep to a large skin ulcer (series 3, image 175). More superficial skin ulceration over the dorsal wrist and base of the thumb. Asymmetric skin thickening and soft tissue swelling involving the proximal volar forearm, most of the wrist, and the dorsal hand. No rim enhancing fluid collection. No subcutaneous emphysema. Reactive epitrochlear lymph nodes.  No soft tissue mass. IMPRESSION: 1. Multiple superficial skin ulcers as described above, with 6 mm broken needle fragment approximately 4 mm deep to the antecubital  fossa ulcer. 2. Asymmetric skin thickening and soft tissue swelling involving the proximal volar forearm, most of the wrist, and the dorsal hand, most consistent with cellulitis given clinical history. No discrete ab

## 2021-08-25 NOTE — Progress Notes (Signed)
?PROGRESS NOTE ? ? ? ?Michele Mahoney  JQZ:[id number] DOB: 06-24-1990 DOA: 08/24/2021 ?PCP: Richardean Chimera, MD ? ? ?Brief Narrative:  ?Michele Mahoney is a 31 y.o. female with medical history significant of IV drug use and gastroesophageal reflux disease; who presented to the hospital secondary to pain, swelling and active drainage from multiple wounds affecting her forearms bilaterally.  Patient openly admits to IV drug abuse and "skin popping" with subdermal drug injections.  Patient's had multiple lesions over both upper extremities, in various stages of healing and infection.  Patient also reports profound lower extremity pain and ambulatory dysfunction which is her primary complaint for admission. ? ?Assessment & Plan: ?  ?Principal Problem: ?  Abscess of forearm ?Active Problems: ?  Lactic acidosis ?  IVDU (intravenous drug user) ?  Hypokalemia ? ? ?Sepsis secondary to IV and subdermal narcotic injection POA ?Concurrent abscesses, multiple over bilateral upper extremities, POA  ?Lactic acidosis ?-ID consulted given patient's known history of IV drug abuse, appreciate insight recommendations ?-Patient tolerated oral penicillin challenge today without any overt side effects or issues -reported penicillin allergy appears to be from childhood and has resolved at this point ?-Transition to linezolid/penicillin per ID ?-Continue IV fluids ?  ?Hypokalemia ?-In the setting of decreased oral intake ?-Provide fluid resuscitation ?-Replete electrolytes and follow trend ?-Telemetry monitoring until electrolytes has reached normal limits. ?-Magnesium within normal limits. ?  ?IVDU (intravenous drug user) ?-Cessation counseling has been provided ?-TOC has been made aware to provide outpatient resources to assist patient quitting. ?-Suboxone for opiates withdrawal prevention initiated (last time used over 7 days ago) ?-Follow clinical response and provide supportive care. ?-COWS score 9 ? ?DVT prophylaxis: heparin ?Code Status:  Full ?Family Communication: Mom at bedside ? ?Status is: Inpt ? ?Dispo: The patient is from: Home ?             Anticipated d/c is to: Home ?             Anticipated d/c date is: >72h ?             Patient currently not medically stable for discharge -will require prolonged IV antibiotic therapy in the setting of high risk for discharge with central line may require prolonged hospitalization upwards of 2 to 4 weeks pending cultures and sensitivities and further antibiotic recommendations per ID ? ?Consultants:  ?Infectious disease ? ?Procedures:  ?None ? ?Antimicrobials:  ?Linezolid, penicillin -duration pending cultures and sensitivities ? ?Subjective: ?No acute issues or events overnight, denies nausea vomiting headache fevers chills diarrhea constipation or chest pain ? ?Objective: ?Vitals:  ? 08/24/21 1416 08/24/21 1636 08/24/21 2121 08/25/21 0505  ?BP: 122/70 118/71 115/62 102/66  ?Pulse: 96 87 92 100  ?Resp: 19 18 19 19   ?Temp:  98.2 ?F (36.8 ?C) 98.8 ?F (37.1 ?C) 98 ?F (36.7 ?C)  ?TempSrc:  Oral Oral   ?SpO2: 99% 98% 100% 99%  ?Weight:      ?Height:      ? ? ?Intake/Output Summary (Last 24 hours) at 08/25/2021 0730 ?Last data filed at 08/25/2021 08/27/2021 ?Gross per 24 hour  ?Intake 5121.69 ml  ?Output --  ?Net 5121.69 ml  ? ?Filed Weights  ? 08/24/21 1028  ?Weight: 54.4 kg  ? ? ?Examination: ? ?General:  Pleasantly resting in bed, No acute distress. ?Neck:  Without mass or deformity.  Trachea is midline. ?Lungs:  Clear to auscultate bilaterally without rhonchi, wheeze, or rales. ?Heart:  Regular rate and rhythm.  Without murmurs, rubs, or  gallops. ?Abdomen:  Soft, nontender, nondistended.  Without guarding or rebound. ?Extremities: Multiple bilateral upper extremity ulcerations, deformities and open wounds (see wound care documentation); bilateral hand swelling, 1+ pitting edema distal to the forearm ?Vascular: Diminished radial arterial pulses bilaterally. ? ?Data Reviewed: I have personally reviewed following  labs and imaging studies ? ?CBC: ?Recent Labs  ?Lab 08/24/21 ?1032  ?WBC 21.3*  ?HGB 12.3  ?HCT 36.5  ?MCV 88.0  ?PLT 384  ? ?Basic Metabolic Panel: ?Recent Labs  ?Lab 08/24/21 ?1032 08/24/21 ?1510 08/25/21 ?2440  ?NA 132*  --  132*  ?K 2.9*  --  5.2*  ?CL 95*  --  108  ?CO2 21*  --  17*  ?GLUCOSE 106*  --  98  ?BUN 12  --  10  ?CREATININE 0.74  --  0.52  ?CALCIUM 9.2  --  7.6*  ?MG  --  2.2  --   ?PHOS  --  2.7  --   ? ?GFR: ?Estimated Creatinine Clearance: 87.5 mL/min (by C-G formula based on SCr of 0.52 mg/dL). ?Liver Function Tests: ?No results for input(s): AST, ALT, ALKPHOS, BILITOT, PROT, ALBUMIN in the last 168 hours. ?No results for input(s): LIPASE, AMYLASE in the last 168 hours. ?No results for input(s): AMMONIA in the last 168 hours. ?Coagulation Profile: ?Recent Labs  ?Lab 08/24/21 ?1235  ?INR 1.5*  ? ?Cardiac Enzymes: ?No results for input(s): CKTOTAL, CKMB, CKMBINDEX, TROPONINI in the last 168 hours. ?BNP (last 3 results) ?No results for input(s): PROBNP in the last 8760 hours. ?HbA1C: ?No results for input(s): HGBA1C in the last 72 hours. ?CBG: ?No results for input(s): GLUCAP in the last 168 hours. ?Lipid Profile: ?No results for input(s): CHOL, HDL, LDLCALC, TRIG, CHOLHDL, LDLDIRECT in the last 72 hours. ?Thyroid Function Tests: ?No results for input(s): TSH, T4TOTAL, FREET4, T3FREE, THYROIDAB in the last 72 hours. ?Anemia Panel: ?No results for input(s): VITAMINB12, FOLATE, FERRITIN, TIBC, IRON, RETICCTPCT in the last 72 hours. ?Sepsis Labs: ?Recent Labs  ?Lab 08/24/21 ?1324 08/24/21 ?1510  ?LATICACIDVEN 3.6* 3.0*  ? ? ?Recent Results (from the past 240 hour(s))  ?Blood Culture (routine x 2)     Status: None (Preliminary result)  ? Collection Time: 08/24/21  1:24 PM  ? Specimen: BLOOD  ?Result Value Ref Range Status  ? Specimen Description BLOOD BLOOD RIGHT ARM  Final  ? Special Requests   Final  ?  BOTTLES DRAWN AEROBIC AND ANAEROBIC Blood Culture adequate volume  ? Culture  Setup Time   Final  ?   GRAM POSITIVE COCCI ANAEROBIC AND AEROBIC BOTTLES Gram Stain Report Called to,Read Back By and Verified With: ONDARA,M@[email or web address]  BY MATTHEWS, B 4.19.2023 ?Performed at [facility], [address], [place], [place] [address] ?  ? Culture PENDING  Incomplete  ? Report Status PENDING  Incomplete  ?Aerobic Culture w Gram Stain (superficial specimen)     Status: None (Preliminary result)  ? Collection Time: 08/24/21  1:25 PM  ? Specimen: Abscess  ?Result Value Ref Range Status  ? Specimen Description   Final  ?  ABSCESS LEFT ARM ?Performed at [facility], [address] [place], [place] [address] ?  ? Special Requests   Final  ?  NONE ?Performed at [facility], [address], [place], [place] [address] ?  ? Gram Stain   Final  ?  MODERATE WBC PRESENT,BOTH PMN AND MONONUCLEAR ?FEW GRAM POSITIVE COCCI IN PAIRS AND CHAINS ?Performed at [facility], [address],  [place], [place] [address] ?  ? Culture PENDING  Incomplete  ? Report Status PENDING  Incomplete  ?Blood Culture (routine x 2)     Status: None (Preliminary result)  ? Collection Time: 08/24/21  2:00 PM  ? Specimen: BLOOD  ?Result Value Ref Range Status  ? Specimen Description BLOOD BLOOD RIGHT ARM  Final  ? Special Requests   Final  ?  BOTTLES DRAWN AEROBIC AND ANAEROBIC Blood Culture adequate volume  ? Culture  Setup Time   Final  ?  GRAM POSITIVE COCCI ANAEROBIC AND AEROBIC BOTTLES Gram Stain Report Called to,Read Back By and Verified With: ONDARA,M@[email or web address]  BY MATTHEWS, B 4.19.203 ?Performed at [facility], [address] [place], [place] [address] ?  ? Culture PENDING  Incomplete  ? Report Status PENDING  Incomplete  ?  ? ? ? ? ? ?Radiology Studies: ?DG Forearm Left ? ?Result Date: 08/24/2021 ?CLINICAL DATA:  History of IV drug abuse. Bilateral hand and arm swelling. EXAM: LEFT FOREARM - 2 VIEW COMPARISON:  Wrist radiographs 05/17/2021. FINDINGS: No evidence of acute fracture, dislocation or bone destruction. No significant elbow joint effusion  identified. There is diffuse soft tissue swelling with areas of skin ulceration and/or soft tissue emphysema dorsally at the wrist and in the radial aspect of the antecubital fossa. There is a small linear metal

## 2021-08-25 NOTE — Progress Notes (Signed)
Bp was soft , Marisue Ivan RN doing Ampicillin test on patient she tolerated well. No need to escalate at this time.  ? ? 08/25/21 1240  ?Assess: MEWS Score  ?Temp 99.5 ?F (37.5 ?C)  ?BP (!) 99/49  ?Pulse Rate (!) 109  ?Resp 20  ?Level of Consciousness Alert  ?SpO2 99 %  ?O2 Device Room Air  ?Patient Activity (if Appropriate) In bed  ?Assess: MEWS Score  ?MEWS Temp 0  ?MEWS Systolic 1  ?MEWS Pulse 1  ?MEWS RR 0  ?MEWS LOC 0  ?MEWS Score 2  ?MEWS Score Color Yellow  ? ? ?

## 2021-08-25 NOTE — Progress Notes (Signed)
?   08/25/21 1319  ?Assess: MEWS Score  ?BP 101/64  ?Pulse Rate (!) 108  ?Resp 20  ?Level of Consciousness Alert  ?Assess: MEWS Score  ?MEWS Temp 0  ?MEWS Systolic 0  ?MEWS Pulse 1  ?MEWS RR 0  ?MEWS LOC 0  ?MEWS Score 1  ?MEWS Score Color Green  ?Assess: if the MEWS score is Yellow or Red  ?Were vital signs taken at a resting state? Yes  ?Focused Assessment No change from prior assessment  ?Early Detection of Sepsis Score *See Row Information* Low  ?MEWS guidelines implemented *See Row Information* No, vital signs rechecked  ?Treat  ?MEWS Interventions Other (Comment)  ?Pain Scale 0-10  ?Pain Score 3  ?Notify: Charge Nurse/RN  ?Name of Charge Nurse/RN Notified Sharol Roussel < RN  ?Date Charge Nurse/RN Notified 08/25/21  ?Time Charge Nurse/RN Notified 1323  ?Notify: Provider  ?Provider Name/Title Dr, Natale Milch  ?Date Provider Notified 08/25/21  ?Time Provider Notified 1323  ?Notification Type Page  ?Notification Reason Other (Comment)  ?Document  ?Patient Outcome Stabilized after interventions  ?Progress note created (see row info) Yes  ? ? ?

## 2021-08-26 LAB — CBC
HCT: 25.1 % — ABNORMAL LOW (ref 36.0–46.0)
Hemoglobin: 8.6 g/dL — ABNORMAL LOW (ref 12.0–15.0)
MCH: 29.9 pg (ref 26.0–34.0)
MCHC: 34.3 g/dL (ref 30.0–36.0)
MCV: 87.2 fL (ref 80.0–100.0)
Platelets: 268 10*3/uL (ref 150–400)
RBC: 2.88 MIL/uL — ABNORMAL LOW (ref 3.87–5.11)
RDW: 14.2 % (ref 11.5–15.5)
WBC: 20.2 10*3/uL — ABNORMAL HIGH (ref 4.0–10.5)
nRBC: 0.5 % — ABNORMAL HIGH (ref 0.0–0.2)

## 2021-08-26 LAB — BASIC METABOLIC PANEL
Anion gap: 8 (ref 5–15)
BUN: 7 mg/dL (ref 6–20)
CO2: 20 mmol/L — ABNORMAL LOW (ref 22–32)
Calcium: 7.6 mg/dL — ABNORMAL LOW (ref 8.9–10.3)
Chloride: 109 mmol/L (ref 98–111)
Creatinine, Ser: 0.49 mg/dL (ref 0.44–1.00)
GFR, Estimated: >60 mL/min (ref >60)
Glucose, Bld: 94 mg/dL (ref 70–99)
Potassium: 4.1 mmol/L (ref 3.5–5.1)
Sodium: 137 mmol/L (ref 135–145)

## 2021-08-26 NOTE — Progress Notes (Signed)
?PROGRESS NOTE ? ? ? ?Michele Mahoney  [id number] DOB: [birth year] DOA: 08/24/2021 ?PCP: Richardean Chimeraaniel, Terry G, MD ? ? ?Brief Narrative:  ?Michele Mahoney is a 31 y.o. female with medical history significant of IV drug use and gastroesophageal reflux disease; who presented to the hospital secondary to pain, swelling and active drainage from multiple wounds affecting her forearms bilaterally.  Patient openly admits to IV drug abuse and "skin popping" with subdermal drug injections.  Patient's had multiple lesions over both upper extremities, in various stages of healing and infection.  Patient also reports profound lower extremity pain and ambulatory dysfunction which is her primary complaint for admission. ? ?Assessment & Plan: ?  ?Principal Problem: ?  Abscess of forearm ?Active Problems: ?  Lactic acidosis ?  IVDU (intravenous drug user) ?  Hypokalemia ?  Soft tissue infection ?  Group A streptococcal infection ?  Bacteremia ? ? ?Sepsis secondary to IV and subdermal narcotic injection POA ?Concurrent abscesses, multiple over bilateral upper extremities, POA  ?Lactic acidosis ?-ID consulted given patient's known history of IV drug abuse, appreciate insight recommendations ?-Patient tolerated oral penicillin challenge today without any overt side effects or issues -reported penicillin allergy appears to be from childhood and has resolved at this point ?-Transition to linezolid/penicillin per ID ?-Preliminary micro shows group A strep pyogenes ?-Continue IV fluids ?  ?Hypokalemia ?-In the setting of decreased oral intake ?-Subsequent hyperkalemia in setting of repletion, resolved ?-Provide fluid resuscitation ?-Replete electrolytes and follow trend ? ?Illicit substance abuse (intravenous drug user) ?-Cessation counseling has been provided ?-TOC has been made aware to provide outpatient resources to assist patient quitting. ?-Suboxone for opiates withdrawal prevention initiated (last time used over 7 days ago) ?-Follow clinical  response and provide supportive care. ?-COWS score 9 ? ?DVT prophylaxis: heparin ?Code Status: Full ?Family Communication: Mom at bedside ? ?Status is: Inpt ? ?Dispo: The patient is from: Home ?             Anticipated d/c is to: Home ?             Anticipated d/c date is: >72h ?             Patient currently not medically stable for discharge -will require prolonged IV antibiotic therapy in the setting of high risk for discharge with central line may require prolonged hospitalization upwards of 2 to 4 weeks pending cultures and sensitivities and further antibiotic recommendations per ID ? ?Consultants:  ?Infectious disease ? ?Procedures:  ?None ? ?Antimicrobials:  ?Linezolid, penicillin -duration pending cultures and sensitivities ? ?Subjective: ?No acute issues or events overnight, denies nausea vomiting headache fevers chills diarrhea constipation or chest pain ? ?Objective: ?Vitals:  ? 08/25/21 1506 08/25/21 2206 08/26/21 0417 08/26/21 0543  ?BP: 104/65 108/61 109/68 118/70  ?Pulse: (!) 104 (!) 103 (!) 112 89  ?Resp:   18 16  ?Temp: 98 ?F (36.7 ?C) 98.9 ?F (37.2 ?C) 98.6 ?F (37 ?C) 98.3 ?F (36.8 ?C)  ?TempSrc: Oral Oral  Oral  ?SpO2: 100% 99% 98% 100%  ?Weight:      ?Height:      ? ? ?Intake/Output Summary (Last 24 hours) at 08/26/2021 0714 ?Last data filed at 08/25/2021 1611 ?Gross per 24 hour  ?Intake 100 ml  ?Output --  ?Net 100 ml  ? ? ?Filed Weights  ? 08/24/21 1028  ?Weight: 54.4 kg  ? ? ?Examination: ? ?General:  Pleasantly resting in bed, No acute distress. ?Neck:  Without mass or deformity.  Trachea  is midline. ?Lungs:  Clear to auscultate bilaterally without rhonchi, wheeze, or rales. ?Heart:  Regular rate and rhythm.  Without murmurs, rubs, or gallops. ?Abdomen:  Soft, nontender, nondistended.  Without guarding or rebound. ?Extremities: Multiple bilateral upper extremity ulcerations, deformities and open wounds (see wound care documentation); bilateral hand swelling, 1+ pitting edema distal to the  forearm ?Vascular: Diminished radial arterial pulses bilaterally. ? ?Data Reviewed: I have personally reviewed following labs and imaging studies ? ?CBC: ?Recent Labs  ?Lab 08/24/21 ?1032 08/25/21 ?0620  ?WBC 21.3* 17.9*  ?HGB 12.3 9.6*  ?HCT 36.5 30.2*  ?MCV 88.0 94.4  ?PLT 384 200  ? ? ?Basic Metabolic Panel: ?Recent Labs  ?Lab 08/24/21 ?1032 08/24/21 ?1510 08/25/21 ?4656  ?NA 132*  --  132*  ?K 2.9*  --  5.2*  ?CL 95*  --  108  ?CO2 21*  --  17*  ?GLUCOSE 106*  --  98  ?BUN 12  --  10  ?CREATININE 0.74  --  0.52  ?CALCIUM 9.2  --  7.6*  ?MG  --  2.2  --   ?PHOS  --  2.7  --   ? ? ?GFR: ?Estimated Creatinine Clearance: 87.5 mL/min (by C-G formula based on SCr of 0.52 mg/dL). ?Liver Function Tests: ?No results for input(s): AST, ALT, ALKPHOS, BILITOT, PROT, ALBUMIN in the last 168 hours. ?No results for input(s): LIPASE, AMYLASE in the last 168 hours. ?No results for input(s): AMMONIA in the last 168 hours. ?Coagulation Profile: ?Recent Labs  ?Lab 08/24/21 ?1235  ?INR 1.5*  ? ? ?Cardiac Enzymes: ?No results for input(s): CKTOTAL, CKMB, CKMBINDEX, TROPONINI in the last 168 hours. ?BNP (last 3 results) ?No results for input(s): PROBNP in the last 8760 hours. ?HbA1C: ?No results for input(s): HGBA1C in the last 72 hours. ?CBG: ?No results for input(s): GLUCAP in the last 168 hours. ?Lipid Profile: ?No results for input(s): CHOL, HDL, LDLCALC, TRIG, CHOLHDL, LDLDIRECT in the last 72 hours. ?Thyroid Function Tests: ?No results for input(s): TSH, T4TOTAL, FREET4, T3FREE, THYROIDAB in the last 72 hours. ?Anemia Panel: ?No results for input(s): VITAMINB12, FOLATE, FERRITIN, TIBC, IRON, RETICCTPCT in the last 72 hours. ?Sepsis Labs: ?Recent Labs  ?Lab 08/24/21 ?1324 08/24/21 ?1510  ?LATICACIDVEN 3.6* 3.0*  ? ? ? ?Recent Results (from the past 240 hour(s))  ?Blood Culture (routine x 2)     Status: None (Preliminary result)  ? Collection Time: 08/24/21  1:24 PM  ? Specimen: BLOOD  ?Result Value Ref Range Status  ? Specimen  Description   Final  ?  BLOOD BLOOD RIGHT ARM ?Performed at [facility], [address], [place], [place] [address] ?  ? Special Requests   Final  ?  BOTTLES DRAWN AEROBIC AND ANAEROBIC Blood Culture adequate volume ?Performed at [facility], [address] [place], [place] [address] ?  ? Culture  Setup Time   Final  ?  GRAM POSITIVE COCCI ANAEROBIC AND AEROBIC BOTTLES Gram Stain Report Called to,Read Back By and Verified With: ONDARA,M@[email or web address]  BY MATTHEWS, B 4.19.2023 ?CRITICAL VALUE NOTED.  VALUE IS CONSISTENT WITH PREVIOUSLY REPORTED AND CALLED VALUE. ?Performed at [facility], [address], [place], [place] [address] ?  ? Culture GRAM POSITIVE COCCI IN CHAINS  Final  ? Report Status PENDING  Incomplete  ?Aerobic Culture w Gram Stain (superficial specimen)     Status: None (Preliminary result)  ? Collection Time: 08/24/21  1:25 PM  ? Specimen: Abscess  ?Result Value Ref Range Status  ?  Specimen Description   Final  ?  ABSCESS LEFT ARM ?Performed at [facility], [address] [place], [place], [place] [address] ?  ? Special Requests   Final  ?  NONE ?Performed at [facility], [address] [place], [place] [address] ?  ? Gram Stain   Final  ?  MODERATE WBC PRESENT,BOTH PMN AND MONONUCLEAR ?FEW GRAM POSITIVE COCCI IN PAIRS AND CHAINS ?  ? Culture   Final  ?  ABUNDANT GROUP A STREP (S.PYOGENES) ISOLATED ?Beta hemolytic streptococci are predictably susceptible to penicillin and other beta lactams. Susceptibility testing not routinely performed. ?CULTURE REINCUBATED FOR BETTER GROWTH ?Performed at [facility], [address], [place], [place] [address] ?  ? Report Status PENDING  Incomplete  ?Blood Culture (routine x 2)     Status: None (Preliminary result)  ? Collection Time: 08/24/21  2:00 PM  ? Specimen: BLOOD  ?Result Value Ref Range Status  ? Specimen Description   Final  ?  BLOOD BLOOD RIGHT ARM ?Performed at [facility], [address], [place], [place] [address] ?  ? Special Requests   Final  ?   BOTTLES DRAWN AEROBIC AND ANAEROBIC Blood Culture adequate volume ?Performed at [facility], [address] [place], [place] [address] ?  ? Culture  Setup Time   Final  ?  GRAM POSITIVE COCCI ANAEROBIC AN

## 2021-08-27 ENCOUNTER — Inpatient Hospital Stay (HOSPITAL_COMMUNITY): Payer: Medicaid Other

## 2021-08-27 DIAGNOSIS — R7881 Bacteremia: Secondary | ICD-10-CM

## 2021-08-27 LAB — ECHOCARDIOGRAM COMPLETE
AR max vel: 2.87 cm2
AV Area VTI: 2.73 cm2
AV Area mean vel: 2.65 cm2
AV Mean grad: 4 mmHg
AV Peak grad: 7.7 mmHg
Ao pk vel: 1.39 m/s
Area-P 1/2: 4.96 cm2
Calc EF: 62 %
Height: 64 in
MV VTI: 4.05 cm2
S' Lateral: 2.7 cm
Single Plane A2C EF: 67.6 %
Single Plane A4C EF: 58.2 %
Weight: 1920 oz

## 2021-08-27 LAB — CBC
HCT: 24.9 % — ABNORMAL LOW (ref 36.0–46.0)
Hemoglobin: 8.5 g/dL — ABNORMAL LOW (ref 12.0–15.0)
MCH: 30.5 pg (ref 26.0–34.0)
MCHC: 34.1 g/dL (ref 30.0–36.0)
MCV: 89.2 fL (ref 80.0–100.0)
Platelets: 231 10*3/uL (ref 150–400)
RBC: 2.79 MIL/uL — ABNORMAL LOW (ref 3.87–5.11)
RDW: 14.6 % (ref 11.5–15.5)
WBC: 15.5 10*3/uL — ABNORMAL HIGH (ref 4.0–10.5)
nRBC: 0.4 % — ABNORMAL HIGH (ref 0.0–0.2)

## 2021-08-27 LAB — CULTURE, BLOOD (ROUTINE X 2)
Special Requests: ADEQUATE
Special Requests: ADEQUATE

## 2021-08-27 LAB — BASIC METABOLIC PANEL
Anion gap: 7 (ref 5–15)
BUN: 8 mg/dL (ref 6–20)
CO2: 21 mmol/L — ABNORMAL LOW (ref 22–32)
Calcium: 8.3 mg/dL — ABNORMAL LOW (ref 8.9–10.3)
Chloride: 110 mmol/L (ref 98–111)
Creatinine, Ser: 0.59 mg/dL (ref 0.44–1.00)
GFR, Estimated: >60 mL/min (ref >60)
Glucose, Bld: 100 mg/dL — ABNORMAL HIGH (ref 70–99)
Potassium: 5.2 mmol/L — ABNORMAL HIGH (ref 3.5–5.1)
Sodium: 138 mmol/L (ref 135–145)

## 2021-08-27 MED ORDER — AMOXICILLIN 500 MG PO CAPS: 1000.0000 mg | ORAL_CAPSULE | Freq: Three times a day (TID) | ORAL | 0 refills | Status: AC

## 2021-08-27 NOTE — Progress Notes (Signed)
Patient left ama ? ? ?Discussed with team she can take 3 weeks of amoxicillin 1 gram tid (patient unfunded, and this is most feasible option) ? ?

## 2021-08-27 NOTE — Progress Notes (Signed)
*  PRELIMINARY RESULTS* ?Echocardiogram ?2D Echocardiogram has been performed. ? ?Michele Mahoney ?08/27/2021, 10:24 AM ?

## 2021-08-27 NOTE — Progress Notes (Signed)
Patient stated she wanted to leave AMA.  Stated she was "tired of being here". Explained to patient how severe her infection was and that she was receiving the recommended IV medication for her infection.  Also advised her of the risk involved with leaving and not completing treatment, worsening condition and possible death. Patient stated that she understood the risks and still wanted to leave. MD notified of situation. Patient's IV removed and patient left floor ambulatory in stable condition.  ? ?

## 2021-08-27 NOTE — Progress Notes (Signed)
? ?  ORTHOPAEDIC PROGRESS NOTE ? ?SUBJECTIVE: ?Patient is stable.  No issues.  She states her pain is improving.  She has increased range of motion.  They have continued with dressing changes, and she has been evaluated by infectious disease. ? ?OBJECTIVE: ?PE: ? ?Alert and oriented.  No acute distress. ? ?Left upper extremity with diffuse swelling, primarily distally.  Excoriations over the dorsal and radial aspect of the forearm.  She has an ulceration over the antecubital fossa.  This has been receiving regular dressing changes.  There is no purulent drainage.  No evidence of a foreign body.  The fibrinous tissue that was present before has improved.  She has improved range of motion of all fingers, although she continues to have soft, dorsal swelling on the hand. ? ?Evaluation of the right upper extremity demonstrates diffuse swelling most prominent distally.  She has excoriations over the dorsal forearm.  She is almost able to make a full fist.  She has diffuse swelling over the dorsum of her hand, but this is soft. ? ?Vitals:  ? 08/26/21 0543 08/26/21 1337  ?BP: 118/70 111/65  ?Pulse: 89 85  ?Resp: 16 18  ?Temp: 98.3 ?F (36.8 ?C) 98.4 ?F (36.9 ?C)  ?SpO2: 100% 100%  ? ? ?  Latest Ref Rng & Units 08/27/2021  ?  8:33 AM 08/26/2021  ?  7:39 AM 08/25/2021  ?  6:20 AM  ?CBC  ?WBC 4.0 - 10.5 K/uL 15.5   20.2   17.9    ?Hemoglobin 12.0 - 15.0 g/dL 8.5   8.6   9.6    ?Hematocrit 36.0 - 46.0 % 24.9   25.1   30.2    ?Platelets 150 - 400 K/uL 231   268   200    ? ? ? ?ASSESSMENT: ?Michele Mahoney is a 31 y.o. female stable with diffuse swelling of bilateral upper extremities.  Initial improvement with antibiotics, followed by an increase in her white count.  Most recent results demonstrate a downtrend in her white count. ? ?PLAN: ?Weightbearing: WBAT RUE and LUE ?Insicional and dressing care: Reinforce dressings as needed continue with local wound care, at the recommendation of the wound care nurse. ?Orthopedic device(s):  None ?VTE prophylaxis: Discretion of primary team, no orthopedic contraindication. ?Pain control: As needed ?Follow - up plan: To be determined; it is unclear what is causing her symptoms currently.  Initial improvement in white count, followed by a relative increase.  This morning, the white count is improving.  She does continue to feel better, and is overall stable.  May have to consider evaluation by hand specialist for further considerations.  Will discuss with the medicine team ? ? ?Contact information:   ? ? ?Melinda Pottinger A. Dallas Schimke, MD MS ?[facility] ?[address] ?[place],  [place]  [address] ?Phone: [phone] ?Fax: [phone] ? ? ?

## 2021-08-27 NOTE — Discharge Summary (Signed)
Physician Discharge Summary  ?Michele Mahoney HYQ:[id number] DOB: 24-May-1990 DOA: 08/24/2021 ? ?PCP: Richardean Chimera, MD ? ?Admit date: 08/24/2021 ?Discharge date: 08/27/2021 ? ?Admitted From: Home ?Disposition: Home/AMA ? ?Discharge Condition: Guarded ?CODE STATUS: Full ? ?Brief/Interim Summary: ?Michele Mahoney is a 31 y.o. female with medical history significant of IV drug use and gastroesophageal reflux disease; who presented to the hospital secondary to pain, swelling and active drainage from multiple wounds affecting her forearms bilaterally.  Patient openly admits to IV drug abuse and "skin popping" with subdermal drug injections.  Patient's had multiple lesions over both upper extremities, in various stages of healing and infection.  Patient also reports profound lower extremity pain and ambulatory dysfunction which is her primary complaint for admission. ? ?Patient was admitted as above with bilateral upper extremity lesions after illicit substance injections, ID and hand surgery were consulted given patient's risk factors.  Discussion today to transfer patient down to [facility] given patient's ongoing bilateral hand range of motion limitations for further evaluation and treatment.  Plan was to continue IV antibiotics over the next few days and pending further imaging and evaluation with hand surgery discussed discharge on p.o. antibiotics. ? ?Unfortunately patient left hospital AMA prior to completion of her medical therapy today.  Discussed with patient high risk of leaving the hospital AGAINST MEDICAL ADVICE including increased morbidity mortality and given her bacteremia and refusing further antibiotics her risks of death are incredibly elevated.  She understood these risks and still chose to leave the hospital. ? ?Discharge Diagnoses:  ?Principal Problem: ?  Abscess of forearm ?Active Problems: ?  Lactic acidosis ?  IVDU (intravenous drug user) ?  Hypokalemia ?  Soft tissue infection ?  Group A  streptococcal infection ?  Bacteremia ? ? ?Allergies  ?Allergen Reactions  ? Sulfa Antibiotics Rash  ? ? ?Consultations: ?Infectious disease, hand surgery ? ?Procedures/Studies: ?DG Forearm Left ? ?Result Date: 08/24/2021 ?CLINICAL DATA:  History of IV drug abuse. Bilateral hand and arm swelling. EXAM: LEFT FOREARM - 2 VIEW COMPARISON:  Wrist radiographs 05/17/2021. FINDINGS: No evidence of acute fracture, dislocation or bone destruction. No significant elbow joint effusion identified. There is diffuse soft tissue swelling with areas of skin ulceration and/or soft tissue emphysema dorsally at the wrist and in the radial aspect of the antecubital fossa. There is a small linear metallic foreign body within the radial aspect of the antecubital fossa. IMPRESSION: 1. Soft tissue swelling with areas of soft tissue emphysema and/or skin ulceration as described, suspicious for soft tissue infection. 2. Linear metallic foreign body within the soft tissues of the antecubital fossa. 3. No evidence of osteomyelitis or septic arthritis. Electronically Signed   By: Carey Bullocks M.D.   On: 08/24/2021 14:00  ? ?DG Forearm Right ? ?Result Date: 08/24/2021 ?CLINICAL DATA:  IV drug abuser with bilateral hand and arm swelling. Evaluate for gas. EXAM: RIGHT FOREARM - 2 VIEW COMPARISON:  Wrist radiographs 05/17/2021.  CT 06/04/2019. FINDINGS: No evidence of acute fracture, dislocation or bone destruction. No evidence of large elbow joint effusion. There is diffuse soft tissue swelling in the forearm with areas of skin ulceration and/or soft tissue emphysema in the radial aspect of the elbow and the radial aspect of the wrist. Two linear metallic foreign bodies are present within the radial soft tissues, at least one of which was present previously. IMPRESSION: 1. Diffuse soft tissue swelling with areas of soft tissue emphysema and/or skin ulceration as described. Findings suspicious for soft tissue  infection. 2. Linear metallic foreign  bodies within the soft tissues of the mid forearm, at least one of which was present previously. 3. No evidence of osteomyelitis. Electronically Signed   By: Carey BullocksWilliam  Veazey M.D.   On: 08/24/2021 13:58  ? ?CT FOREARM LEFT W CONTRAST ? ?Result Date: 08/24/2021 ?CLINICAL DATA:  Left forearm and hand abscesses.  IV drug user. EXAM: CT OF THE UPPER LEFT EXTREMITY WITH CONTRAST TECHNIQUE: Multidetector CT imaging of the left hand and forearm was performed according to the standard protocol following intravenous contrast administration. RADIATION DOSE REDUCTION: This exam was performed according to the departmental dose-optimization program which includes automated exposure control, adjustment of the mA and/or kV according to patient size and/or use of iterative reconstruction technique. CONTRAST:  75mL OMNIPAQUE IOHEXOL 300 MG/ML  SOLN COMPARISON:  Left forearm x-rays from same day. Left wrist x-rays dated May 17, 2021. FINDINGS: Bones/Joint/Cartilage No fracture or dislocation. Joint spaces are preserved. No joint effusion. Ligaments Ligaments are suboptimally evaluated by CT. Muscles and Tendons Flexor tendon sheath fluid at the wrist proximal to the carpal tunnel and extending into the first, fourth, and fifth flexor tendon sheaths in the palmar hand (series 10, images 45 and 76). Flexor and extensor tendons are grossly intact. No muscle atrophy. Soft tissue Subcutaneous 6 mm linear radiopaque density in the antecubital fossa approximately 4 mm deep to a large skin ulcer (series 3, image 175). More superficial skin ulceration over the dorsal wrist and base of the thumb. Asymmetric skin thickening and soft tissue swelling involving the proximal volar forearm, most of the wrist, and the dorsal hand. No rim enhancing fluid collection. No subcutaneous emphysema. Reactive epitrochlear lymph nodes.  No soft tissue mass. IMPRESSION: 1. Multiple superficial skin ulcers as described above, with 6 mm broken needle fragment  approximately 4 mm deep to the antecubital fossa ulcer. 2. Asymmetric skin thickening and soft tissue swelling involving the proximal volar forearm, most of the wrist, and the dorsal hand, most consistent with cellulitis given clinical history. No discrete abscess. 3. Flexor tenosynovitis at the wrist and involving the first, fourth and fifth flexor tendon sheaths in the palmar hand. 4. No acute osseous abnormality. Electronically Signed   By: Obie DredgeWilliam T Derry M.D.   On: 08/24/2021 16:13  ? ?CT HAND LEFT W CONTRAST ? ?Result Date: 08/24/2021 ?CLINICAL DATA:  Left forearm and hand abscesses.  IV drug user. EXAM: CT OF THE UPPER LEFT EXTREMITY WITH CONTRAST TECHNIQUE: Multidetector CT imaging of the left hand and forearm was performed according to the standard protocol following intravenous contrast administration. RADIATION DOSE REDUCTION: This exam was performed according to the departmental dose-optimization program which includes automated exposure control, adjustment of the mA and/or kV according to patient size and/or use of iterative reconstruction technique. CONTRAST:  75mL OMNIPAQUE IOHEXOL 300 MG/ML  SOLN COMPARISON:  Left forearm x-rays from same day. Left wrist x-rays dated May 17, 2021. FINDINGS: Bones/Joint/Cartilage No fracture or dislocation. Joint spaces are preserved. No joint effusion. Ligaments Ligaments are suboptimally evaluated by CT. Muscles and Tendons Flexor tendon sheath fluid at the wrist proximal to the carpal tunnel and extending into the first, fourth, and fifth flexor tendon sheaths in the palmar hand (series 10, images 45 and 76). Flexor and extensor tendons are grossly intact. No muscle atrophy. Soft tissue Subcutaneous 6 mm linear radiopaque density in the antecubital fossa approximately 4 mm deep to a large skin ulcer (series 3, image 175). More superficial skin ulceration over the dorsal wrist and  base of the thumb. Asymmetric skin thickening and soft tissue swelling involving the  proximal volar forearm, most of the wrist, and the dorsal hand. No rim enhancing fluid collection. No subcutaneous emphysema. Reactive epitrochlear lymph nodes.  No soft tissue mass. IMPRESSION: 1. Multiple

## 2021-08-27 NOTE — Progress Notes (Signed)
ID virtual note ? ? ?Abx: ?4/19-c pcn g ?4/19-c linezolid ?  ?4/18-19 aztreonam ?4/18-19 vanc                                                    ?  ?  ?Assessment: ?31 yo female with ivdu/skin popping, chronic open wound bilateral UE admitted with sepsis found to have bilateral UE soft tissue infection along with group a strep bacteremia ?  ?Ct showed left anticubital fossa with broken needle retained ?  ?High burden initial bacteremia. While GAS doesn't usually cause endocarditis, would try to rule this out. Also will do concomittant linezolid along with a beta-lactam for [employer] effect ?  ?She tolerated amoxicillin oral challenge; feels comfortable she doesn't have a true pcn allergy or had grown out of it ?  ?Orthopedic had evaluated patient and doesn't feel there is evidence of nec-fasc ? ?------------- ?4/21 assessment ?Wbc improving ?Repeat blood cx negative ? ?Tte is pending ? ?  ?Plan: ?continue linezolid and pcn g ?Await tte ?If continued clinical improvement in soft tissue swelling/cellulitic change and down trending wbc, and no sepsis, by this weekend can peel off linezolid ?Discussed with primary team ?

## 2021-08-28 LAB — AEROBIC CULTURE W GRAM STAIN (SUPERFICIAL SPECIMEN)

## 2021-08-30 ENCOUNTER — Inpatient Hospital Stay (HOSPITAL_COMMUNITY)
Admission: EM | Admit: 2021-08-30 | Discharge: 2021-09-01 | DRG: 464 | Discharge status: Against Medical Advice | Payer: Self-pay | Attending: Internal Medicine | Admitting: Internal Medicine

## 2021-08-30 ENCOUNTER — Emergency Department (HOSPITAL_COMMUNITY): Payer: Self-pay

## 2021-08-30 DIAGNOSIS — M795 Residual foreign body in soft tissue: Secondary | ICD-10-CM | POA: Diagnosis present

## 2021-08-30 DIAGNOSIS — L02512 Cutaneous abscess of left hand: Secondary | ICD-10-CM | POA: Diagnosis present

## 2021-08-30 DIAGNOSIS — B954 Other streptococcus as the cause of diseases classified elsewhere: Secondary | ICD-10-CM | POA: Diagnosis present

## 2021-08-30 DIAGNOSIS — B950 Streptococcus, group A, as the cause of diseases classified elsewhere: Secondary | ICD-10-CM | POA: Diagnosis present

## 2021-08-30 DIAGNOSIS — F17210 Nicotine dependence, cigarettes, uncomplicated: Secondary | ICD-10-CM | POA: Diagnosis present

## 2021-08-30 DIAGNOSIS — L03119 Cellulitis of unspecified part of limb: Secondary | ICD-10-CM

## 2021-08-30 DIAGNOSIS — Z880 Allergy status to penicillin: Secondary | ICD-10-CM

## 2021-08-30 DIAGNOSIS — E8809 Other disorders of plasma-protein metabolism, not elsewhere classified: Secondary | ICD-10-CM | POA: Diagnosis present

## 2021-08-30 DIAGNOSIS — L02419 Cutaneous abscess of limb, unspecified: Secondary | ICD-10-CM | POA: Diagnosis present

## 2021-08-30 DIAGNOSIS — B955 Unspecified streptococcus as the cause of diseases classified elsewhere: Secondary | ICD-10-CM | POA: Diagnosis present

## 2021-08-30 DIAGNOSIS — F1490 Cocaine use, unspecified, uncomplicated: Secondary | ICD-10-CM | POA: Diagnosis present

## 2021-08-30 DIAGNOSIS — Z882 Allergy status to sulfonamides status: Secondary | ICD-10-CM

## 2021-08-30 DIAGNOSIS — F419 Anxiety disorder, unspecified: Secondary | ICD-10-CM | POA: Diagnosis present

## 2021-08-30 DIAGNOSIS — L02413 Cutaneous abscess of right upper limb: Secondary | ICD-10-CM | POA: Diagnosis present

## 2021-08-30 DIAGNOSIS — L02414 Cutaneous abscess of left upper limb: Secondary | ICD-10-CM | POA: Diagnosis present

## 2021-08-30 DIAGNOSIS — D689 Coagulation defect, unspecified: Secondary | ICD-10-CM | POA: Diagnosis present

## 2021-08-30 DIAGNOSIS — I272 Pulmonary hypertension, unspecified: Secondary | ICD-10-CM | POA: Diagnosis present

## 2021-08-30 DIAGNOSIS — F1120 Opioid dependence, uncomplicated: Secondary | ICD-10-CM | POA: Diagnosis present

## 2021-08-30 DIAGNOSIS — M65149 Other infective (teno)synovitis, unspecified hand: Principal | ICD-10-CM | POA: Diagnosis present

## 2021-08-30 DIAGNOSIS — F1990 Other psychoactive substance use, unspecified, uncomplicated: Secondary | ICD-10-CM | POA: Diagnosis present

## 2021-08-30 DIAGNOSIS — M659 Synovitis and tenosynovitis, unspecified: Secondary | ICD-10-CM | POA: Diagnosis present

## 2021-08-30 DIAGNOSIS — G5602 Carpal tunnel syndrome, left upper limb: Secondary | ICD-10-CM | POA: Diagnosis present

## 2021-08-30 DIAGNOSIS — K219 Gastro-esophageal reflux disease without esophagitis: Secondary | ICD-10-CM | POA: Diagnosis present

## 2021-08-30 DIAGNOSIS — L02511 Cutaneous abscess of right hand: Secondary | ICD-10-CM | POA: Diagnosis present

## 2021-08-30 DIAGNOSIS — D638 Anemia in other chronic diseases classified elsewhere: Secondary | ICD-10-CM | POA: Diagnosis present

## 2021-08-30 LAB — CBC WITH DIFFERENTIAL/PLATELET
Abs Immature Granulocytes: 0.24 10*3/uL — ABNORMAL HIGH (ref 0.00–0.07)
Basophils Absolute: 0 10*3/uL (ref 0.0–0.1)
Basophils Relative: 0 %
Eosinophils Absolute: 0.4 10*3/uL (ref 0.0–0.5)
Eosinophils Relative: 3 %
HCT: 26.2 % — ABNORMAL LOW (ref 36.0–46.0)
Hemoglobin: 8.6 g/dL — ABNORMAL LOW (ref 12.0–15.0)
Immature Granulocytes: 2 %
Lymphocytes Relative: 38 %
Lymphs Abs: 4.7 10*3/uL — ABNORMAL HIGH (ref 0.7–4.0)
MCH: 30.7 pg (ref 26.0–34.0)
MCHC: 32.8 g/dL (ref 30.0–36.0)
MCV: 93.6 fL (ref 80.0–100.0)
Monocytes Absolute: 0.7 10*3/uL (ref 0.1–1.0)
Monocytes Relative: 6 %
Neutro Abs: 6.4 10*3/uL (ref 1.7–7.7)
Neutrophils Relative %: 51 %
Platelets: 238 10*3/uL (ref 150–400)
RBC: 2.8 MIL/uL — ABNORMAL LOW (ref 3.87–5.11)
RDW: 14.7 % (ref 11.5–15.5)
WBC: 12.4 10*3/uL — ABNORMAL HIGH (ref 4.0–10.5)
nRBC: 0.2 % (ref 0.0–0.2)

## 2021-08-30 LAB — LACTIC ACID, PLASMA
Lactic Acid, Venous: 1.7 mmol/L (ref 0.5–1.9)
Lactic Acid, Venous: 1 mmol/L (ref 0.5–1.9)

## 2021-08-30 LAB — COMPREHENSIVE METABOLIC PANEL
ALT: 19 U/L (ref 0–44)
AST: 20 U/L (ref 15–41)
Albumin: 2.4 g/dL — ABNORMAL LOW (ref 3.5–5.0)
Alkaline Phosphatase: 78 U/L (ref 38–126)
Anion gap: 7 (ref 5–15)
BUN: 5 mg/dL — ABNORMAL LOW (ref 6–20)
CO2: 27 mmol/L (ref 22–32)
Calcium: 8.4 mg/dL — ABNORMAL LOW (ref 8.9–10.3)
Chloride: 105 mmol/L (ref 98–111)
Creatinine, Ser: 0.55 mg/dL (ref 0.44–1.00)
GFR, Estimated: >60 mL/min (ref >60)
Glucose, Bld: 97 mg/dL (ref 70–99)
Potassium: 3.6 mmol/L (ref 3.5–5.1)
Sodium: 139 mmol/L (ref 135–145)
Total Bilirubin: 0.1 mg/dL — ABNORMAL LOW (ref 0.3–1.2)
Total Protein: 6.6 g/dL (ref 6.5–8.1)

## 2021-08-30 LAB — CK: Total CK: 89 U/L (ref 38–234)

## 2021-08-30 LAB — PROTIME-INR
INR: 0.9 (ref 0.8–1.2)
Prothrombin Time: 12.1 seconds (ref 11.4–15.2)

## 2021-08-30 MED ORDER — KETOROLAC TROMETHAMINE 30 MG/ML IJ SOLN
30.0000 mg | Freq: Four times a day (QID) | INTRAMUSCULAR | Status: DC | PRN
Start: 2021-08-30 — End: 2021-09-01
  Administered 2021-08-30 – 2021-09-01 (×6): 30 mg via INTRAVENOUS
  Filled 2021-08-30 (×6): qty 1

## 2021-08-30 MED ORDER — LORAZEPAM 2 MG/ML IJ SOLN: 1.0000 mg | INTRAMUSCULAR | Status: DC | PRN

## 2021-08-30 MED ORDER — LORAZEPAM 1 MG PO TABS
1.0000 mg | ORAL_TABLET | ORAL | Status: DC | PRN
  Administered 2021-08-31 – 2021-09-01 (×2): 1 mg via ORAL
  Filled 2021-08-30 (×2): qty 1

## 2021-08-30 MED ORDER — ACETAMINOPHEN 500 MG PO TABS
1000.0000 mg | ORAL_TABLET | Freq: Four times a day (QID) | ORAL | Status: DC | PRN
  Administered 2021-08-31 – 2021-09-01 (×2): 1000 mg via ORAL
  Filled 2021-08-30 (×2): qty 2

## 2021-08-30 MED ORDER — BISACODYL 5 MG PO TBEC: 5.0000 mg | DELAYED_RELEASE_TABLET | Freq: Every day | ORAL | Status: DC | PRN

## 2021-08-30 MED ORDER — ENOXAPARIN SODIUM 40 MG/0.4ML IJ SOSY
40.0000 mg | PREFILLED_SYRINGE | INTRAMUSCULAR | Status: DC
  Administered 2021-08-30 – 2021-08-31 (×2): 40 mg via SUBCUTANEOUS
  Filled 2021-08-30 (×2): qty 0.4

## 2021-08-30 MED ORDER — LACTATED RINGERS IV BOLUS (SEPSIS)
500.0000 mL | Freq: Once | INTRAVENOUS | Status: AC
  Administered 2021-08-30: 500 mL via INTRAVENOUS

## 2021-08-30 MED ORDER — ONDANSETRON HCL 4 MG/2ML IJ SOLN: 4.0000 mg | Freq: Four times a day (QID) | INTRAMUSCULAR | Status: DC | PRN

## 2021-08-30 MED ORDER — LACTATED RINGERS IV BOLUS (SEPSIS)
1000.0000 mL | Freq: Once | INTRAVENOUS | Status: AC
  Administered 2021-08-30: 1000 mL via INTRAVENOUS

## 2021-08-30 MED ORDER — DICYCLOMINE HCL 10 MG PO CAPS
10.0000 mg | ORAL_CAPSULE | Freq: Three times a day (TID) | ORAL | Status: DC
  Administered 2021-08-30 – 2021-09-01 (×4): 10 mg via ORAL
  Filled 2021-08-30 (×7): qty 1

## 2021-08-30 MED ORDER — ONDANSETRON HCL 4 MG PO TABS: 4.0000 mg | ORAL_TABLET | Freq: Four times a day (QID) | ORAL | Status: DC | PRN

## 2021-08-30 MED ORDER — THIAMINE HCL 100 MG PO TABS
100.0000 mg | ORAL_TABLET | Freq: Every day | ORAL | Status: DC
  Administered 2021-08-30 – 2021-09-01 (×3): 100 mg via ORAL
  Filled 2021-08-30 (×3): qty 1

## 2021-08-30 MED ORDER — SODIUM CHLORIDE 0.9 % IV SOLN
2.0000 g | INTRAVENOUS | Status: DC
  Administered 2021-08-30: 2 g via INTRAVENOUS
  Filled 2021-08-30: qty 20

## 2021-08-30 MED ORDER — VANCOMYCIN HCL IN DEXTROSE 1-5 GM/200ML-% IV SOLN
1000.0000 mg | Freq: Once | INTRAVENOUS | Status: AC
  Administered 2021-08-30: 1000 mg via INTRAVENOUS
  Filled 2021-08-30: qty 200

## 2021-08-30 MED ORDER — HYDROXYZINE HCL 10 MG PO TABS
10.0000 mg | ORAL_TABLET | Freq: Three times a day (TID) | ORAL | Status: DC | PRN
  Filled 2021-08-30 (×2): qty 1

## 2021-08-30 MED ORDER — THIAMINE HCL 100 MG/ML IJ SOLN: 100.0000 mg | Freq: Every day | INTRAMUSCULAR | Status: DC

## 2021-08-30 MED ORDER — IBUPROFEN 200 MG PO TABS: 800.0000 mg | ORAL_TABLET | Freq: Once | ORAL | Status: DC

## 2021-08-30 MED ORDER — LACTATED RINGERS IV BOLUS (SEPSIS)
250.0000 mL | Freq: Once | INTRAVENOUS | Status: AC
Start: 2021-08-30 — End: 2021-08-30
  Administered 2021-08-30: 250 mL via INTRAVENOUS

## 2021-08-30 MED ORDER — DEXTROSE 5 % IV SOLN
12.0000 10*6.[IU] | Freq: Two times a day (BID) | INTRAVENOUS | Status: DC
  Administered 2021-08-30 – 2021-09-01 (×5): 12 10*6.[IU] via INTRAVENOUS
  Filled 2021-08-30 (×7): qty 12

## 2021-08-30 MED ORDER — ACETAMINOPHEN 650 MG RE SUPP: 650.0000 mg | Freq: Four times a day (QID) | RECTAL | Status: DC | PRN

## 2021-08-30 MED ORDER — FOLIC ACID 1 MG PO TABS
1.0000 mg | ORAL_TABLET | Freq: Every day | ORAL | Status: DC
  Administered 2021-08-30 – 2021-09-01 (×3): 1 mg via ORAL
  Filled 2021-08-30 (×3): qty 1

## 2021-08-30 MED ORDER — VANCOMYCIN HCL IN DEXTROSE 1-5 GM/200ML-% IV SOLN
1000.0000 mg | Freq: Two times a day (BID) | INTRAVENOUS | Status: DC
  Administered 2021-08-31 – 2021-09-01 (×4): 1000 mg via INTRAVENOUS
  Filled 2021-08-30 (×5): qty 200

## 2021-08-30 MED ORDER — LACTATED RINGERS IV SOLN: INTRAVENOUS | Status: AC

## 2021-08-30 MED ORDER — ACETAMINOPHEN 325 MG PO TABS
650.0000 mg | ORAL_TABLET | Freq: Four times a day (QID) | ORAL | Status: DC | PRN
  Administered 2021-09-01: 650 mg via ORAL
  Filled 2021-08-30: qty 2

## 2021-08-30 MED ORDER — IOHEXOL 300 MG/ML  SOLN
100.0000 mL | Freq: Once | INTRAMUSCULAR | Status: AC | PRN
  Administered 2021-08-30: 100 mL via INTRAVENOUS

## 2021-08-30 MED ORDER — ADULT MULTIVITAMIN W/MINERALS CH
1.0000 | ORAL_TABLET | Freq: Every day | ORAL | Status: DC
  Administered 2021-08-30 – 2021-09-01 (×3): 1 via ORAL
  Filled 2021-08-30 (×3): qty 1

## 2021-08-30 MED ORDER — CLONIDINE HCL 0.1 MG PO TABS: 0.1000 mg | ORAL_TABLET | Freq: Three times a day (TID) | ORAL | Status: DC | PRN

## 2021-08-30 NOTE — ED Triage Notes (Signed)
Patient coming from home, complaint of infection to bilateral hands, was seen at [facility] and left. ?

## 2021-08-30 NOTE — Progress Notes (Signed)
Pharmacy Antibiotic Note ? ?Michele Mahoney is a 31 y.o. female admitted on 08/30/2021 with cellulitis/bacteremia.  Pharmacy has been consulted for vancomycin dosing. ? ?WBC elevated. SCr wnl  ? ?Plan: ?-Vancomycin 1 gm IV Q 12 hours ?-Monitor CBC, renal fx, cultures and clinical progress ?-Vanc levels as indicated  ?-Pen G IV per MD  ? ?  ? ?Temp (24hrs), Avg:97.7 ?F (36.5 ?C), Min:97.7 ?F (36.5 ?C), Max:97.7 ?F (36.5 ?C) ? ?Recent Labs  ?Lab 08/24/21 ?1032 08/24/21 ?1324 08/24/21 ?1510 08/25/21 ?3295 08/25/21 ?1884 08/26/21 ?1660 08/27/21 ?6301 08/27/21 ?6010 08/30/21 ?1107  ?WBC 21.3*  --   --  17.9*  --  20.2*  --  15.5* 12.4*  ?CREATININE 0.74  --   --   --  0.52 0.49 0.59  --  0.55  ?LATICACIDVEN  --  3.6* 3.0*  --   --   --   --   --  1.7  ?  ?Estimated Creatinine Clearance: 87.5 mL/min (by C-G formula based on SCr of 0.55 mg/dL).   ? ?Allergies  ?Allergen Reactions  ? Penicillins Rash  ?  Has patient had a PCN reaction causing immediate rash, facial/tongue/throat swelling, SOB or lightheadedness with hypotension: Yes ?Has patient had a PCN reaction causing severe rash involving mucus membranes or skin necrosis: No ?Has patient had a PCN reaction that required hospitalization No ?Has patient had a PCN reaction occurring within the last 10 years: No ?If all of the above answers are "NO", then may proceed with Cephalosporin use. ? ?Pt states she has a test done and it showed + allergy  ? Sulfa Antibiotics Rash  ? ? ?Antimicrobials this admission: ?Vancomycin 4/24 >>  ?Pen G 4/24 >>  ? ?Dose adjustments this admission: ? ? ?Microbiology results: ?4/20 BCx >> ngtd ?4/18 BCx >> 4/4 GAS ?4/18 L arm abscess >> MRSA   ? ?Thank you for allowing pharmacy to be a part of this patient?s care. ? ?Vinnie Level, PharmD., BCCCP ?Clinical Pharmacist ?Please refer to AMION for unit-specific pharmacist  ? ? ?

## 2021-08-30 NOTE — H&P (Signed)
?History and Physical  ? ? ?Michele Mahoney WVP:[id number] DOB: 1990-05-21 DOA: 08/30/2021 ? ?PCP: Richardean Chimera, MD (Confirm with patient/family/NH records and if not entered, this has to be entered at [facility] point of entry) ?Patient coming from: Home ? ?I have personally briefly reviewed patient's old medical records in [facility] ? ?Chief Complaint: My hands need to be treated ? ?HPI: Michele Mahoney is a 31 y.o. female with medical history significant of IV heroin abuse, recently diagnosed bilateral forearm abscesses and deep tissue infection with strep pyogenous, with transient strep pyogenes bacteremia which resolved on 08/26/21, who signed AMA from any past hospital 3 days ago, coming back today to seek medical assistance. ? ?Patient went to [facility] on 04/18 for worsening of bilateral arm swelling, rash and pain, she was worked up for bilateral forearm abscesses and deep tissue infection and was supposed to come to [facility] for hand surgery for incision and drainage.  She had brief bacteremia on initial blood culture on 4/18, which resolved on 4/20 and her echocardiogram on 4/21 showed no significant vegetation but elevated pulmonary hypertension.  Patient claimed that she was told that she need to come to [facility] for surgery before she left any pain hospital 3 days ago.  She claims that her bilateral arm swelling and pain remain the same, she used 1 time of IV heroine 2 days ago.  And today she started to have withdrawal symptoms including feeling anxious, cramping abdominal pain and mild small diarrhea x1.  She reported bilateral forearm and hand and finger pain remain the same, denies any numbness of the fingertips bilaterally. ? ?ED Course: Afebrile, no tachycardia no hypotension. ? ?CT bilateral arm and hand showed multiple abscesses and signs of deep tissue infection. ? ?As per pharmacist recommendation, starting patient on vancomycin and penicillin G ? ?Review of Systems: As per HPI otherwise 14 point  review of systems negative.  ? ? ?No past medical history on file. ? ?Past Surgical History:  ?Procedure Laterality Date  ? FRACTURE SURGERY    ? ? ? reports that she has been smoking cigarettes. She has been smoking an average of .5 packs per day. She has never used smokeless tobacco. She reports current drug use. Drug: IV. She reports that she does not drink alcohol. ? ?Allergies  ?Allergen Reactions  ? Penicillins Rash  ?  Has patient had a PCN reaction causing immediate rash, facial/tongue/throat swelling, SOB or lightheadedness with hypotension: Yes ?Has patient had a PCN reaction causing severe rash involving mucus membranes or skin necrosis: No ?Has patient had a PCN reaction that required hospitalization No ?Has patient had a PCN reaction occurring within the last 10 years: No ?If all of the above answers are "NO", then may proceed with Cephalosporin use. ? ?Pt states she has a test done and it showed + allergy  ? Sulfa Antibiotics Rash  ? ? ?Family History  ?Problem Relation Age of Onset  ? Hypertension Mother   ? Diabetes Mother   ? Diabetes Other   ? Hypertension Other   ? ? ? ?Prior to Admission medications   ?Medication Sig Start Date End Date Taking? Authorizing Provider  ?acetaminophen (TYLENOL) 500 MG tablet Take 1,000 mg by mouth every 6 (six) hours as needed for mild pain or moderate pain.     [provider]  ?amoxicillin (AMOXIL) 500 MG capsule Take 2 capsules (1,000 mg total) by mouth 3 (three) times daily for 21 days. 08/27/21 09/17/21  Natale Milch,  Kristine Garbe, MD  ?naproxen (NAPROSYN) 375 MG tablet Take 1 tablet (375 mg total) by mouth 2 (two) times daily. ?Patient not taking: Reported on 08/24/2021 09/21/19   Arthor Captain, PA-C  ? ? ?Physical Exam: ?Vitals:  ? 08/30/21 0945 08/30/21 1547  ?BP: 122/81 (!) 162/113  ?Pulse: (!) 105 97  ?Resp: 16 18  ?Temp: 97.7 ?F (36.5 ?C)   ?TempSrc: Oral   ?SpO2: 100% 97%  ? ? ?Constitutional: NAD, calm, comfortable ?Vitals:  ? 08/30/21 0945 08/30/21  1547  ?BP: 122/81 (!) 162/113  ?Pulse: (!) 105 97  ?Resp: 16 18  ?Temp: 97.7 ?F (36.5 ?C)   ?TempSrc: Oral   ?SpO2: 100% 97%  ? ?Eyes: PERRL, lids and conjunctivae normal ?ENMT: Mucous membranes are moist. Posterior pharynx clear of any exudate or lesions.Normal dentition.  ?Neck: normal, supple, no masses, no thyromegaly ?Respiratory: clear to auscultation bilaterally, no wheezing, no crackles. Normal respiratory effort. No accessory muscle use.  ?Cardiovascular: Regular rate and rhythm, no murmurs / rubs / gallops. No extremity edema. 2+ pedal pulses. No carotid bruits.  ?Abdomen: no tenderness, no masses palpated. No hepatosplenomegaly. Bowel sounds positive.  ?Musculoskeletal: Significant swelling rash of bilateral forearms and bilateral hands, mild tenderness of bilateral hand and fingers, able to bend DIPs of all 10 fingers, PIPs and MTPs decreased flexion's.  Light touch sensation all 10 fingers maintained, capillary refill brisk all 10 fingers. ?Neurologic: CN 2-12 grossly intact. Sensation intact, DTR normal. Strength 5/5 in all 4.  ?Psychiatric: Normal judgment and insight. Alert and oriented x 3. Normal mood.  ? ? ? ?Labs on Admission: I have personally reviewed following labs and imaging studies ? ?CBC: ?Recent Labs  ?Lab 08/24/21 ?1032 08/25/21 ?0620 08/26/21 ?0739 08/27/21 ?5176 08/30/21 ?1107  ?WBC 21.3* 17.9* 20.2* 15.5* 12.4*  ?NEUTROABS  --   --   --   --  6.4  ?HGB 12.3 9.6* 8.6* 8.5* 8.6*  ?HCT 36.5 30.2* 25.1* 24.9* 26.2*  ?MCV 88.0 94.4 87.2 89.2 93.6  ?PLT 384 200 268 231 238  ? ?Basic Metabolic Panel: ?Recent Labs  ?Lab 08/24/21 ?1032 08/24/21 ?1510 08/25/21 ?1607 08/26/21 ?3710 08/27/21 ?6269 08/30/21 ?1107  ?NA 132*  --  132* 137 138 139  ?K 2.9*  --  5.2* 4.1 5.2* 3.6  ?CL 95*  --  108 109 110 105  ?CO2 21*  --  17* 20* 21* 27  ?GLUCOSE 106*  --  98 94 100* 97  ?BUN 12  --  10 7 8  5*  ?CREATININE 0.74  --  0.52 0.49 0.59 0.55  ?CALCIUM 9.2  --  7.6* 7.6* 8.3* 8.4*  ?MG  --  2.2  --   --    --   --   ?PHOS  --  2.7  --   --   --   --   ? ?GFR: ?Estimated Creatinine Clearance: 87.5 mL/min (by C-G formula based on SCr of 0.55 mg/dL). ?Liver Function Tests: ?Recent Labs  ?Lab 08/30/21 ?1107  ?AST 20  ?ALT 19  ?ALKPHOS 78  ?BILITOT 0.1*  ?PROT 6.6  ?ALBUMIN 2.4*  ? ?No results for input(s): LIPASE, AMYLASE in the last 168 hours. ?No results for input(s): AMMONIA in the last 168 hours. ?Coagulation Profile: ?Recent Labs  ?Lab 08/24/21 ?1235  ?INR 1.5*  ? ?Cardiac Enzymes: ?No results for input(s): CKTOTAL, CKMB, CKMBINDEX, TROPONINI in the last 168 hours. ?BNP (last 3 results) ?No results for input(s): PROBNP in the last 8760 hours. ?HbA1C: ?No  results for input(s): HGBA1C in the last 72 hours. ?CBG: ?No results for input(s): GLUCAP in the last 168 hours. ?Lipid Profile: ?No results for input(s): CHOL, HDL, LDLCALC, TRIG, CHOLHDL, LDLDIRECT in the last 72 hours. ?Thyroid Function Tests: ?No results for input(s): TSH, T4TOTAL, FREET4, T3FREE, THYROIDAB in the last 72 hours. ?Anemia Panel: ?No results for input(s): VITAMINB12, FOLATE, FERRITIN, TIBC, IRON, RETICCTPCT in the last 72 hours. ?Urine analysis: ?   ?Component Value Date/Time  ? COLORURINE YELLOW 08/24/2021 2017  ? APPEARANCEUR CLEAR 08/24/2021 2017  ? LABSPEC >1.046 (H) 08/24/2021 2017  ? PHURINE 6.0 08/24/2021 2017  ? GLUCOSEU NEGATIVE 08/24/2021 2017  ? HGBUR SMALL (A) 08/24/2021 2017  ? BILIRUBINUR NEGATIVE 08/24/2021 2017  ? KETONESUR 5 (A) 08/24/2021 2017  ? PROTEINUR 30 (A) 08/24/2021 2017  ? UROBILINOGEN 0.2 08/28/2012 1624  ? NITRITE NEGATIVE 08/24/2021 2017  ? LEUKOCYTESUR MODERATE (A) 08/24/2021 2017  ? ? ?Radiological Exams on Admission: ?CT FOREARM LEFT W CONTRAST ? ?Result Date: 08/30/2021 ?CLINICAL DATA:  Soft tissue infection suspected. EXAM: CT OF THE UPPER LEFT EXTREMITY WITH CONTRAST; CT OF THE LEFT HAND WITH CONTRAST TECHNIQUE: Multidetector CT imaging of the upper left extremity was performed from the distal upper arm through the  hand according to the standard protocol following intravenous contrast administration. RADIATION DOSE REDUCTION: This exam was performed according to the departmental dose-optimization program which in

## 2021-08-30 NOTE — Consult Note (Signed)
Reason for Consult:Bilateral hand/FA infections ?Referring Physician: Nanda Quinton ?Time called: [id number] ?Time at bedside: 1405 ? ? ?Michele Mahoney is an 31 y.o. female.  ?HPI: Michele Mahoney comes in with a 2-3 week hx/o bilateral FA pain, swelling, and sores. 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 They had started to improve on their own but then got much worse and she was admitted to [facility] but left AMA. She does not note any improvement while there. She comes to [facility] for further treatment as they had mentioned her seeing a hand surgeon down here anyway. She is RHD and unemployed. She denies prior hx/o similar. ? ?No past medical history on file. ? ?Past Surgical History:  ?Procedure Laterality Date  ? FRACTURE SURGERY    ? ? ?Family History  ?Problem Relation Age of Onset  ? Hypertension Mother   ? Diabetes Mother   ? Diabetes Other   ? Hypertension Other   ? ? ?Social History:  reports that she has been smoking cigarettes. She has been smoking an average of .5 packs per day. She has never used smokeless tobacco. She reports current drug use. Drug: IV. She reports that she does not drink alcohol. ? ?Allergies:  ?Allergies  ?Allergen Reactions  ? Penicillins Rash  ?  Has patient had a PCN reaction causing immediate rash, facial/tongue/throat swelling, SOB or lightheadedness with hypotension: Yes ?Has patient had a PCN reaction causing severe rash involving mucus membranes or skin necrosis: No ?Has patient had a PCN reaction that required hospitalization No ?Has patient had a PCN reaction occurring within the last 10 years: No ?If all of the above answers are "NO", then may proceed with Cephalosporin use. ? ?Pt states she has a test done and it showed + allergy  ? Sulfa Antibiotics Rash  ? ? ?Medications: I have reviewed the patient's current medications. ? ?Results for orders placed or performed during the hospital encounter of 08/30/21 (from the past 48 hour(s))  ?CBC with Differential     Status: Abnormal  ? Collection Time: 08/30/21 11:07  AM  ?Result Value Ref Range  ? WBC 12.4 (H) 4.0 - 10.5 K/uL  ? RBC 2.80 (L) 3.87 - 5.11 MIL/uL  ? Hemoglobin 8.6 (L) 12.0 - 15.0 g/dL  ? HCT 26.2 (L) 36.0 - 46.0 %  ? MCV 93.6 80.0 - 100.0 fL  ? MCH 30.7 26.0 - 34.0 pg  ? MCHC 32.8 30.0 - 36.0 g/dL  ? RDW 14.7 11.5 - 15.5 %  ? Platelets 238 150 - 400 K/uL  ? nRBC 0.2 0.0 - 0.2 %  ? Neutrophils Relative % 51 %  ? Neutro Abs 6.4 1.7 - 7.7 K/uL  ? Lymphocytes Relative 38 %  ? Lymphs Abs 4.7 (H) 0.7 - 4.0 K/uL  ? Monocytes Relative 6 %  ? Monocytes Absolute 0.7 0.1 - 1.0 K/uL  ? Eosinophils Relative 3 %  ? Eosinophils Absolute 0.4 0.0 - 0.5 K/uL  ? Basophils Relative 0 %  ? Basophils Absolute 0.0 0.0 - 0.1 K/uL  ? Immature Granulocytes 2 %  ? Abs Immature Granulocytes 0.24 (H) 0.00 - 0.07 K/uL  ?  Comment: Performed at [facility], [place] [address], [place], [place] [address]  ?Lactic acid, plasma     Status: None  ? Collection Time: 08/30/21 11:07 AM  ?Result Value Ref Range  ? Lactic Acid, Venous 1.7 0.5 - 1.9 mmol/L  ?  Comment: Performed at [facility], [place] [address], [place], [place] [address]  ?  Comprehensive metabolic panel     Status: Abnormal  ? Collection Time: 08/30/21 11:07 AM  ?Result Value Ref Range  ? Sodium 139 135 - 145 mmol/L  ? Potassium 3.6 3.5 - 5.1 mmol/L  ? Chloride 105 98 - 111 mmol/L  ? CO2 27 22 - 32 mmol/L  ? Glucose, Bld 97 70 - 99 mg/dL  ?  Comment: Glucose reference range applies only to samples taken after fasting for at least 8 hours.  ? BUN 5 (L) 6 - 20 mg/dL  ? Creatinine, Ser 0.55 0.44 - 1.00 mg/dL  ? Calcium 8.4 (L) 8.9 - 10.3 mg/dL  ? Total Protein 6.6 6.5 - 8.1 g/dL  ? Albumin 2.4 (L) 3.5 - 5.0 g/dL  ? AST 20 15 - 41 U/L  ? ALT 19 0 - 44 U/L  ? Alkaline Phosphatase 78 38 - 126 U/L  ? Total Bilirubin 0.1 (L) 0.3 - 1.2 mg/dL  ? GFR, Estimated >60 >60 mL/min  ?  Comment: (NOTE) ?Calculated using the CKD-EPI Creatinine Equation (2021) ?  ? Anion gap 7 5 - 15  ?  Comment: Performed at [facility], [place] [address]  [place], [place] [address]  ? ? ?CT FOREARM LEFT W CONTRAST ? ?Result Date: 08/30/2021 ?CLINICAL DATA:  Soft tissue infection suspected. EXAM: CT OF THE UPPER LEFT EXTREMITY WITH CONTRAST; CT OF THE LEFT HAND WITH CONTRAST TECHNIQUE: Multidetector CT imaging of the upper left extremity was performed from the distal upper arm through the hand according to the standard protocol following intravenous contrast administration. RADIATION DOSE REDUCTION: This exam was performed according to the departmental dose-optimization program which includes automated exposure control, adjustment of the mA and/or kV according to patient size and/or use of iterative reconstruction technique. CONTRAST:  165mL OMNIPAQUE IOHEXOL 300 MG/ML  SOLN COMPARISON:  Prior CTs 08/24/2021. FINDINGS: Bones/Joint/Cartilage No evidence of acute fracture, dislocation or bone destruction. There is no significant elbow or wrist joint effusion or abnormal synovial enhancement. Ligaments Suboptimally assessed by CT. Muscles and Tendons Progressive flexor tendon tenosynovitis with peripheral synovial enhancement extending from the proximal forearm into the hand consistent with progressive infectious tenosynovitis. Within the hand, the greatest involvement is within the thenar and hypothenar eminences. No gross tendon rupture or extension into the fingers. The extensor tendons appear unremarkable. Soft tissues Soft tissue ulceration within the antecubital fossa and adjacent linear metallic foreign body are unchanged. Surrounding subcutaneous edema and skin thickening in this area are unchanged, without focal fluid collection. Generalized subcutaneous edema throughout the forearm and hand without other focal fluid collections separate from the extensive flexor tenosynovitis described above. No soft tissue emphysema. Reactive epitrochlear lymph nodes are again noted. IMPRESSION: 1. Compared with the previous CT of 6 days ago, there is worsening flexor tenosynovitis  extending from the proximal left forearm into the palmar aspect of the hand. There is associated synovial enhancement, and in this context, these findings are highly suspicious for infectious tenosynovitis. 2. Apart from the flexor tendons, no other focal fluid collections are identified. 3. No evidence of septic arthritis or osteomyelitis. 4. Stable superficial linear metallic foreign body within the antecubital fossa. Electronically Signed   By: Richardean Sale M.D.   On: 08/30/2021 13:35  ? ?CT FOREARM RIGHT W CONTRAST ? ?Result Date: 08/30/2021 ?CLINICAL DATA:  Soft tissue infection of the forearm and hand EXAM: CT OF THE UPPER RIGHT HAND WITH CONTRAST CT OF THE UPPER RIGHT FOREARM WITH CONTRAST TECHNIQUE: Multidetector CT imaging of the  upper right hand was performed according to the standard protocol following intravenous contrast administration. Multidetector CT imaging of the upper right forearm was performed according to the standard protocol following intravenous contrast administration. RADIATION DOSE REDUCTION: This exam was performed according to the departmental dose-optimization program which includes automated exposure control, adjustment of the mA and/or kV according to patient size and/or use of iterative reconstruction technique. CONTRAST:  116mL OMNIPAQUE IOHEXOL 300 MG/ML  SOLN COMPARISON:  X-ray 08/24/2021 FINDINGS: Bones/Joint/Cartilage No fracture or dislocation. Normal alignment. No joint effusion. Ligaments Ligaments are suboptimally evaluated by CT. Muscles and Tendons Muscles are normal. No muscle atrophy. No intramuscular fluid collection or hematoma. Soft tissue Soft tissue edema involving the right forearm and hand with skin thickening consistent with cellulitis. 6.7 x 1.3 x 6.4 cm fluid collection along the superficial aspect of the distal dorsal musculature of the forearm concerning for an abscess. 15 mm linear foreign body in the subcutaneous fat of the mid dorsal forearm with a  portion embedded in the muscle. Complex fluid collection in the subcutaneous fat of the dorsal aspect of the wrist measuring 8 x 8 x 28 mm concerning for an abscess. 1.1 x 0.8 x 19 mm complex fluid colle

## 2021-08-30 NOTE — ED Provider Notes (Signed)
?[facility] ?Provider Note ? ? ?CSN: [id number] ?Arrival date & time: 08/30/21  [id number] ? ?  ? ?History ? ?Chief Complaint  ?Patient presents with  ? Hand Problem  ? ? ?Michele CourseKisha Mahoney is a 31 y.o. female with PMH significant for IV drug use and GERD who presents to the ED again for evaluation and treatment of bilateral upper extremity infection and lesions secondary to IV drug use.  Patient left AMA from [facility] 3 days ago after a 2 day admission stating that she "had things to take care of at home."  Plan at the time of her discharge was to transfer over to [facility] for evaluation and treatment with ID and hand surgery.  Since patient insisted on leaving, she was sent home with a Mahoney of amoxicillin.  She believes the lesions on her left upper arm are worsening as well on her right dorsal hand.  She also notes swelling of the bilateral upper extremities has continued to increase.  She denies numbness, tingling, fevers and chills.  She has no other systemic complaints. ? ?HPI ? ?  ? ?Home Medications ?Prior to Admission medications   ?Medication Sig Start Date End Date Taking? Authorizing Provider  ?acetaminophen (TYLENOL) 500 MG tablet Take 1,000 mg by mouth every 6 (six) hours as needed for mild pain or moderate pain.     [provider]  ?amoxicillin (AMOXIL) 500 MG capsule Take 2 capsules (1,000 mg total) by mouth 3 (three) times daily for 21 days. 08/27/21 09/17/21  Azucena FallenLancaster, William C, MD  ?naproxen (NAPROSYN) 375 MG tablet Take 1 tablet (375 mg total) by mouth 2 (two) times daily. ?Patient not taking: Reported on 08/24/2021 09/21/19   Arthor CaptainHarris, Abigail, PA-C  ?   ? ?Allergies    ?Penicillins and Sulfa antibiotics   ? ?Review of Systems   ?Review of Systems ? ?Physical Exam ?Updated Vital Signs ?BP 122/81 (BP Location: Right Arm)   Pulse (!) 105   Temp 97.7 ?F (36.5 ?C) (Oral)   Resp 16   SpO2 100%  ?Physical Exam ?Vitals and nursing note reviewed.   ?Constitutional:   ?   General: She is not in acute distress. ?   Appearance: She is not ill-appearing.  ?HENT:  ?   Head: Atraumatic.  ?Eyes:  ?   Conjunctiva/sclera: Conjunctivae normal.  ?Cardiovascular:  ?   Rate and Rhythm: Normal rate and regular rhythm.  ?   Pulses: Normal pulses.  ?   Heart sounds: No murmur heard. ?Pulmonary:  ?   Effort: Pulmonary effort is normal. No respiratory distress.  ?   Breath sounds: Normal breath sounds.  ?Abdominal:  ?   General: Abdomen is flat. There is no distension.  ?   Palpations: Abdomen is soft.  ?   Tenderness: There is no abdominal tenderness.  ?Musculoskeletal:     ?   General: Normal range of motion.  ?   Cervical back: Normal range of motion.  ?Skin: ?   General: Skin is warm and dry.  ?   Capillary Refill: Capillary refill takes less than 2 seconds.  ?   Comments: Bilateral upper extremities with numerous lesions with purulent drainage.  Significant erythema, tenderness and swelling from just proximal to the elbow down to her fingertips.  Sensation intact.  2+ radial pulses bilaterally.  ?Neurological:  ?   General: No focal deficit present.  ?   Mental Status: She is alert.  ?Psychiatric:     ?  Mood and Affect: Mood normal.  ? ? ? ? ? ?ED Results / Procedures / Treatments   ?Labs ?(all labs ordered are listed, but only abnormal results are displayed) ?Labs Reviewed  ?CBC WITH DIFFERENTIAL/PLATELET - Abnormal; Notable for the following components:  ?    Result Value  ? WBC 12.4 (*)   ? RBC 2.80 (*)   ? Hemoglobin 8.6 (*)   ? HCT 26.2 (*)   ? Lymphs Abs 4.7 (*)   ? Abs Immature Granulocytes 0.24 (*)   ? All other components within normal limits  ?COMPREHENSIVE METABOLIC PANEL - Abnormal; Notable for the following components:  ? BUN 5 (*)   ? Calcium 8.4 (*)   ? Albumin 2.4 (*)   ? Total Bilirubin 0.1 (*)   ? All other components within normal limits  ?LACTIC ACID, PLASMA  ?LACTIC ACID, PLASMA  ? ? ?EKG ?None ? ?Radiology ?CT FOREARM LEFT W CONTRAST ? ?Result  Date: 08/30/2021 ?CLINICAL DATA:  Soft tissue infection suspected. EXAM: CT OF THE UPPER LEFT EXTREMITY WITH CONTRAST; CT OF THE LEFT HAND WITH CONTRAST TECHNIQUE: Multidetector CT imaging of the upper left extremity was performed from the distal upper arm through the hand according to the standard protocol following intravenous contrast administration. RADIATION DOSE REDUCTION: This exam was performed according to the departmental dose-optimization program which includes automated exposure control, adjustment of the mA and/or kV according to patient size and/or use of iterative reconstruction technique. CONTRAST:  OMNIPAQUE IOHEXOL 300 MG/ML  SOLN COMPARISON:  Prior CTs 08/24/2021. FINDINGS: Bones/Joint/Cartilage No evidence of acute fracture, dislocation or bone destruction. There is no significant elbow or wrist joint effusion or abnormal synovial enhancement. Ligaments Suboptimally assessed by CT. Muscles and Tendons Progressive flexor tendon tenosynovitis with peripheral synovial enhancement extending from the proximal forearm into the hand consistent with progressive infectious tenosynovitis. Within the hand, the greatest involvement is within the thenar and hypothenar eminences. No gross tendon rupture or extension into the fingers. The extensor tendons appear unremarkable. Soft tissues Soft tissue ulceration within the antecubital fossa and adjacent linear metallic foreign body are unchanged. Surrounding subcutaneous edema and skin thickening in this area are unchanged, without focal fluid collection. Generalized subcutaneous edema throughout the forearm and hand without other focal fluid collections separate from the extensive flexor tenosynovitis described above. No soft tissue emphysema. Reactive epitrochlear lymph nodes are again noted. IMPRESSION: 1. Compared with the previous CT of 6 days ago, there is worsening flexor tenosynovitis extending from the proximal left forearm into the palmar aspect  of the hand. There is associated synovial enhancement, and in this context, these findings are highly suspicious for infectious tenosynovitis. 2. Apart from the flexor tendons, no other focal fluid collections are identified. 3. No evidence of septic arthritis or osteomyelitis. 4. Stable superficial linear metallic foreign body within the antecubital fossa. Electronically Signed   By: Carey Bullocks M.D.   On: 08/30/2021 13:35  ? ?CT FOREARM RIGHT W CONTRAST ? ?Result Date: 08/30/2021 ?CLINICAL DATA:  Soft tissue infection of the forearm and hand EXAM: CT OF THE UPPER RIGHT HAND WITH CONTRAST CT OF THE UPPER RIGHT FOREARM WITH CONTRAST TECHNIQUE: Multidetector CT imaging of the upper right hand was performed according to the standard protocol following intravenous contrast administration. Multidetector CT imaging of the upper right forearm was performed according to the standard protocol following intravenous contrast administration. RADIATION DOSE REDUCTION: This exam was performed according to the departmental dose-optimization program which includes automated exposure control, adjustment  of the mA and/or kV according to patient size and/or use of iterative reconstruction technique. CONTRAST:  OMNIPAQUE IOHEXOL 300 MG/ML  SOLN COMPARISON:  X-ray 08/24/2021 FINDINGS: Bones/Joint/Cartilage No fracture or dislocation. Normal alignment. No joint effusion. Ligaments Ligaments are suboptimally evaluated by CT. Muscles and Tendons Muscles are normal. No muscle atrophy. No intramuscular fluid collection or hematoma. Soft tissue Soft tissue edema involving the right forearm and hand with skin thickening consistent with cellulitis. 6.7 x 1.3 x 6.4 cm fluid collection along the superficial aspect of the distal dorsal musculature of the forearm concerning for an abscess. 15 mm linear foreign body in the subcutaneous fat of the mid dorsal forearm with a portion embedded in the muscle. Complex fluid collection in the  subcutaneous fat of the dorsal aspect of the wrist measuring 8 x 8 x 28 mm concerning for an abscess. 1.1 x 0.8 x 19 mm complex fluid collection in the subcutaneous fat overlying the olecranon consistent with an abscess. No sof

## 2021-08-30 NOTE — Progress Notes (Signed)
PIV consult: Peripheral IV options limited. Please consider tunneled line for prolonged inpatient infusions. ?

## 2021-08-31 ENCOUNTER — Inpatient Hospital Stay (HOSPITAL_COMMUNITY): Payer: Self-pay | Admitting: Certified Registered Nurse Anesthetist

## 2021-08-31 ENCOUNTER — Encounter (HOSPITAL_COMMUNITY)
Admission: EM | Discharge status: Against Medical Advice | Payer: Self-pay | Source: Home / Self Care | Attending: Internal Medicine

## 2021-08-31 ENCOUNTER — Other Ambulatory Visit: Payer: Self-pay

## 2021-08-31 ENCOUNTER — Other Ambulatory Visit (HOSPITAL_COMMUNITY): Payer: Self-pay

## 2021-08-31 ENCOUNTER — Encounter (HOSPITAL_COMMUNITY): Payer: Self-pay | Admitting: Internal Medicine

## 2021-08-31 DIAGNOSIS — L089 Local infection of the skin and subcutaneous tissue, unspecified: Secondary | ICD-10-CM

## 2021-08-31 HISTORY — PX: I & D EXTREMITY: SHX5045

## 2021-08-31 LAB — BASIC METABOLIC PANEL
Anion gap: 5 (ref 5–15)
BUN: <5 mg/dL — ABNORMAL LOW (ref 6–20)
CO2: 26 mmol/L (ref 22–32)
Calcium: 8.6 mg/dL — ABNORMAL LOW (ref 8.9–10.3)
Chloride: 108 mmol/L (ref 98–111)
Creatinine, Ser: 0.55 mg/dL (ref 0.44–1.00)
GFR, Estimated: >60 mL/min (ref >60)
Glucose, Bld: 99 mg/dL (ref 70–99)
Potassium: 3.8 mmol/L (ref 3.5–5.1)
Sodium: 139 mmol/L (ref 135–145)

## 2021-08-31 LAB — CBC
HCT: 26.1 % — ABNORMAL LOW (ref 36.0–46.0)
Hemoglobin: 8.4 g/dL — ABNORMAL LOW (ref 12.0–15.0)
MCH: 30.3 pg (ref 26.0–34.0)
MCHC: 32.2 g/dL (ref 30.0–36.0)
MCV: 94.2 fL (ref 80.0–100.0)
Platelets: 207 10*3/uL (ref 150–400)
RBC: 2.77 MIL/uL — ABNORMAL LOW (ref 3.87–5.11)
RDW: 14.9 % (ref 11.5–15.5)
WBC: 9.9 10*3/uL (ref 4.0–10.5)
nRBC: 0.2 % (ref 0.0–0.2)

## 2021-08-31 LAB — SURGICAL PCR SCREEN
MRSA, PCR: NEGATIVE
Staphylococcus aureus: NEGATIVE

## 2021-08-31 LAB — POCT PREGNANCY, URINE: Preg Test, Ur: NEGATIVE

## 2021-08-31 SURGERY — IRRIGATION AND DEBRIDEMENT EXTREMITY
Anesthesia: General | Site: Arm Lower | Laterality: Bilateral

## 2021-08-31 MED ORDER — KETOROLAC TROMETHAMINE 30 MG/ML IJ SOLN
INTRAMUSCULAR | Status: AC
  Filled 2021-08-31: qty 1

## 2021-08-31 MED ORDER — HYDROMORPHONE HCL 1 MG/ML IJ SOLN
0.2500 mg | INTRAMUSCULAR | Status: DC | PRN
  Administered 2021-08-31 (×2): 0.5 mg via INTRAVENOUS

## 2021-08-31 MED ORDER — KETOROLAC TROMETHAMINE 30 MG/ML IJ SOLN
INTRAMUSCULAR | Status: DC | PRN
  Administered 2021-08-31: 30 mg via INTRAVENOUS

## 2021-08-31 MED ORDER — KETAMINE HCL-SODIUM CHLORIDE 100-0.9 MG/10ML-% IV SOSY
PREFILLED_SYRINGE | INTRAVENOUS | Status: DC | PRN
  Administered 2021-08-31: 30 mg via INTRAVENOUS
  Administered 2021-08-31: 20 mg via INTRAVENOUS

## 2021-08-31 MED ORDER — POVIDONE-IODINE 10 % EX SWAB
2.0000 "application " | Freq: Once | CUTANEOUS | Status: AC
  Administered 2021-08-31: 2 via TOPICAL

## 2021-08-31 MED ORDER — CHLORHEXIDINE GLUCONATE 0.12 % MT SOLN
OROMUCOSAL | Status: AC
  Administered 2021-08-31: 15 mL via OROMUCOSAL
  Filled 2021-08-31: qty 15

## 2021-08-31 MED ORDER — ONDANSETRON HCL 4 MG/2ML IJ SOLN
INTRAMUSCULAR | Status: AC
  Filled 2021-08-31: qty 2

## 2021-08-31 MED ORDER — LIDOCAINE 2% (20 MG/ML) 5 ML SYRINGE
INTRAMUSCULAR | Status: DC | PRN
  Administered 2021-08-31: 160 mg via INTRAVENOUS

## 2021-08-31 MED ORDER — BACITRACIN ZINC 500 UNIT/GM EX OINT
TOPICAL_OINTMENT | CUTANEOUS | Status: AC
  Filled 2021-08-31: qty 28.35

## 2021-08-31 MED ORDER — BUPIVACAINE HCL (PF) 0.25 % IJ SOLN
INTRAMUSCULAR | Status: AC
  Filled 2021-08-31: qty 30

## 2021-08-31 MED ORDER — ROCURONIUM BROMIDE 10 MG/ML (PF) SYRINGE
PREFILLED_SYRINGE | INTRAVENOUS | Status: AC
  Filled 2021-08-31: qty 10

## 2021-08-31 MED ORDER — FENTANYL CITRATE (PF) 250 MCG/5ML IJ SOLN
INTRAMUSCULAR | Status: DC | PRN
  Administered 2021-08-31: 100 ug via INTRAVENOUS
  Administered 2021-08-31 (×2): 50 ug via INTRAVENOUS
  Administered 2021-08-31: 100 ug via INTRAVENOUS
  Administered 2021-08-31 (×4): 50 ug via INTRAVENOUS

## 2021-08-31 MED ORDER — HYDROMORPHONE HCL 1 MG/ML IJ SOLN
INTRAMUSCULAR | Status: AC
  Filled 2021-08-31: qty 1

## 2021-08-31 MED ORDER — PROPOFOL 10 MG/ML IV BOLUS
INTRAVENOUS | Status: AC
  Filled 2021-08-31: qty 20

## 2021-08-31 MED ORDER — HYDROMORPHONE HCL 1 MG/ML IJ SOLN
INTRAMUSCULAR | Status: AC
  Filled 2021-08-31: qty 0.5

## 2021-08-31 MED ORDER — PHENYLEPHRINE 80 MCG/ML (10ML) SYRINGE FOR IV PUSH (FOR BLOOD PRESSURE SUPPORT)
PREFILLED_SYRINGE | INTRAVENOUS | Status: AC
  Filled 2021-08-31: qty 10

## 2021-08-31 MED ORDER — CHLORHEXIDINE GLUCONATE 4 % EX LIQD
60.0000 mL | Freq: Once | CUTANEOUS | Status: AC
  Administered 2021-08-31: 4 via TOPICAL
  Filled 2021-08-31: qty 60

## 2021-08-31 MED ORDER — LIDOCAINE 2% (20 MG/ML) 5 ML SYRINGE
INTRAMUSCULAR | Status: AC
  Filled 2021-08-31: qty 5

## 2021-08-31 MED ORDER — FENTANYL CITRATE (PF) 250 MCG/5ML IJ SOLN
INTRAMUSCULAR | Status: AC
  Filled 2021-08-31: qty 5

## 2021-08-31 MED ORDER — HYDROMORPHONE HCL 1 MG/ML IJ SOLN
INTRAMUSCULAR | Status: DC | PRN
Start: 2021-08-31 — End: 2021-08-31
  Administered 2021-08-31 (×4): .5 mg via INTRAVENOUS

## 2021-08-31 MED ORDER — ORAL CARE MOUTH RINSE: 15.0000 mL | Freq: Once | OROMUCOSAL | Status: AC

## 2021-08-31 MED ORDER — MIDAZOLAM HCL 2 MG/2ML IJ SOLN
INTRAMUSCULAR | Status: DC | PRN
  Administered 2021-08-31: 2 mg via INTRAVENOUS

## 2021-08-31 MED ORDER — DEXMEDETOMIDINE (PRECEDEX) IN NS 20 MCG/5ML (4 MCG/ML) IV SYRINGE
PREFILLED_SYRINGE | INTRAVENOUS | Status: DC | PRN
  Administered 2021-08-31 (×2): 8 ug via INTRAVENOUS
  Administered 2021-08-31: 12 ug via INTRAVENOUS
  Administered 2021-08-31: 8 ug via INTRAVENOUS
  Administered 2021-08-31: 4 ug via INTRAVENOUS

## 2021-08-31 MED ORDER — MIDAZOLAM HCL 2 MG/2ML IJ SOLN
INTRAMUSCULAR | Status: AC
  Filled 2021-08-31: qty 2

## 2021-08-31 MED ORDER — LACTATED RINGERS IV SOLN: INTRAVENOUS | Status: DC

## 2021-08-31 MED ORDER — ONDANSETRON HCL 4 MG/2ML IJ SOLN
INTRAMUSCULAR | Status: DC | PRN
  Administered 2021-08-31: 4 mg via INTRAVENOUS

## 2021-08-31 MED ORDER — MUPIROCIN 2 % EX OINT
1.0000 "application " | TOPICAL_OINTMENT | Freq: Two times a day (BID) | CUTANEOUS | Status: DC
  Administered 2021-08-31 – 2021-09-01 (×3): 1 via NASAL
  Filled 2021-08-31: qty 22

## 2021-08-31 MED ORDER — PROPOFOL 10 MG/ML IV BOLUS
INTRAVENOUS | Status: DC | PRN
Start: 2021-08-31 — End: 2021-08-31
  Administered 2021-08-31: 180 mg via INTRAVENOUS

## 2021-08-31 MED ORDER — KETAMINE HCL 50 MG/5ML IJ SOSY
PREFILLED_SYRINGE | INTRAMUSCULAR | Status: AC
  Filled 2021-08-31: qty 5

## 2021-08-31 MED ORDER — CHLORHEXIDINE GLUCONATE 0.12 % MT SOLN: 15.0000 mL | Freq: Once | OROMUCOSAL | Status: AC

## 2021-08-31 MED ORDER — DEXAMETHASONE SODIUM PHOSPHATE 10 MG/ML IJ SOLN
INTRAMUSCULAR | Status: DC | PRN
  Administered 2021-08-31: 5 mg via INTRAVENOUS

## 2021-08-31 MED ORDER — PHENYLEPHRINE 80 MCG/ML (10ML) SYRINGE FOR IV PUSH (FOR BLOOD PRESSURE SUPPORT)
PREFILLED_SYRINGE | INTRAVENOUS | Status: DC | PRN
  Administered 2021-08-31: 80 ug via INTRAVENOUS

## 2021-08-31 MED ORDER — SODIUM CHLORIDE 0.9 % IR SOLN
Status: DC | PRN
Start: 2021-08-31 — End: 2021-08-31
  Administered 2021-08-31 (×2): 3000 mL

## 2021-08-31 MED ORDER — DEXAMETHASONE SODIUM PHOSPHATE 10 MG/ML IJ SOLN
INTRAMUSCULAR | Status: AC
  Filled 2021-08-31: qty 1

## 2021-08-31 MED ORDER — 0.9 % SODIUM CHLORIDE (POUR BTL) OPTIME
TOPICAL | Status: DC | PRN
  Administered 2021-08-31: 1000 mL

## 2021-08-31 SURGICAL SUPPLY — 58 items
BAG COUNTER SPONGE SURGICOUNT (BAG) ×2 IMPLANT
BAG SPNG CNTER NS LX DISP (BAG) ×1
BNDG CMPR 9X4 STRL LF SNTH (GAUZE/BANDAGES/DRESSINGS) ×1
BNDG COHESIVE 1X5 TAN STRL LF (GAUZE/BANDAGES/DRESSINGS) IMPLANT
BNDG CONFORM 2 STRL LF (GAUZE/BANDAGES/DRESSINGS) IMPLANT
BNDG ELASTIC 3X5.8 VLCR STR LF (GAUZE/BANDAGES/DRESSINGS) ×1 IMPLANT
BNDG ELASTIC 4X5.8 VLCR STR LF (GAUZE/BANDAGES/DRESSINGS) ×1 IMPLANT
BNDG ESMARK 4X9 LF (GAUZE/BANDAGES/DRESSINGS) ×2 IMPLANT
BNDG GAUZE ELAST 4 BULKY (GAUZE/BANDAGES/DRESSINGS) ×8 IMPLANT
CORD BIPOLAR FORCEPS 12FT (ELECTRODE) ×2 IMPLANT
COVER SURGICAL LIGHT HANDLE (MISCELLANEOUS) ×2 IMPLANT
CUFF TOURN SGL QUICK 18X4 (TOURNIQUET CUFF) ×3 IMPLANT
DRAIN PENROSE 1/4X12 LTX STRL (WOUND CARE) IMPLANT
DRAPE SURG 17X23 STRL (DRAPES) ×2 IMPLANT
DRSG ADAPTIC 3X8 NADH LF (GAUZE/BANDAGES/DRESSINGS) ×5 IMPLANT
ELECT REM PT RETURN 9FT ADLT (ELECTROSURGICAL) ×2
ELECTRODE REM PT RTRN 9FT ADLT (ELECTROSURGICAL) IMPLANT
GAUZE SPONGE 4X4 12PLY STRL (GAUZE/BANDAGES/DRESSINGS) ×3 IMPLANT
GAUZE XEROFORM 1X8 LF (GAUZE/BANDAGES/DRESSINGS) ×1 IMPLANT
GAUZE XEROFORM 5X9 LF (GAUZE/BANDAGES/DRESSINGS) IMPLANT
GLOVE BIO SURGEON STRL SZ7.5 (GLOVE) ×1 IMPLANT
GLOVE BIOGEL PI IND STRL 8.5 (GLOVE) ×1 IMPLANT
GLOVE BIOGEL PI INDICATOR 8.5 (GLOVE) ×1
GLOVE SURG ORTHO 8.0 STRL STRW (GLOVE) ×2 IMPLANT
GLOVE SURG UNDER POLY LF SZ7.5 (GLOVE) ×4 IMPLANT
GOWN STRL REUS W/ TWL LRG LVL3 (GOWN DISPOSABLE) ×3 IMPLANT
GOWN STRL REUS W/ TWL XL LVL3 (GOWN DISPOSABLE) ×1 IMPLANT
GOWN STRL REUS W/TWL LRG LVL3 (GOWN DISPOSABLE) ×6
GOWN STRL REUS W/TWL XL LVL3 (GOWN DISPOSABLE) ×2
HANDPIECE INTERPULSE COAX TIP (DISPOSABLE)
KIT BASIN OR (CUSTOM PROCEDURE TRAY) ×2 IMPLANT
KIT TURNOVER KIT B (KITS) ×2 IMPLANT
MANIFOLD NEPTUNE II (INSTRUMENTS) ×2 IMPLANT
NDL HYPO 25GX1X1/2 BEV (NEEDLE) IMPLANT
NEEDLE HYPO 25GX1X1/2 BEV (NEEDLE) IMPLANT
NS IRRIG 1000ML POUR BTL (IV SOLUTION) ×2 IMPLANT
PACK ORTHO EXTREMITY (CUSTOM PROCEDURE TRAY) ×2 IMPLANT
PAD ARMBOARD 7.5X6 YLW CONV (MISCELLANEOUS) ×4 IMPLANT
PAD CAST 4YDX4 CTTN HI CHSV (CAST SUPPLIES) ×1 IMPLANT
PADDING CAST COTTON 4X4 STRL (CAST SUPPLIES) ×2
SET CYSTO W/LG BORE CLAMP LF (SET/KITS/TRAYS/PACK) ×1 IMPLANT
SET HNDPC FAN SPRY TIP SCT (DISPOSABLE) IMPLANT
SOAP 2 % CHG 4 OZ (WOUND CARE) ×1 IMPLANT
SPONGE T-LAP 18X18 ~~LOC~~+RFID (SPONGE) ×3 IMPLANT
SPONGE T-LAP 4X18 ~~LOC~~+RFID (SPONGE) ×2 IMPLANT
SUT ETHILON 4 0 PS 2 18 (SUTURE) IMPLANT
SUT ETHILON 5 0 P 3 18 (SUTURE)
SUT NYLON ETHILON 5-0 P-3 1X18 (SUTURE) IMPLANT
SUT PDS AB 2-0 CT1 27 (SUTURE) ×4 IMPLANT
SWAB COLLECTION DEVICE MRSA (MISCELLANEOUS) ×3 IMPLANT
SWAB CULTURE ESWAB REG 1ML (MISCELLANEOUS) ×2 IMPLANT
SYR CONTROL 10ML LL (SYRINGE) ×1 IMPLANT
TOWEL GREEN STERILE (TOWEL DISPOSABLE) ×2 IMPLANT
TOWEL GREEN STERILE FF (TOWEL DISPOSABLE) ×3 IMPLANT
TUBE CONNECTING 12X1/4 (SUCTIONS) ×2 IMPLANT
UNDERPAD 30X36 HEAVY ABSORB (UNDERPADS AND DIAPERS) ×3 IMPLANT
WATER STERILE IRR 1000ML POUR (IV SOLUTION) ×1 IMPLANT
YANKAUER SUCT BULB TIP NO VENT (SUCTIONS) ×2 IMPLANT

## 2021-08-31 NOTE — Consult Note (Signed)
?   ? ? ? ? ?[facility]   ? ?Date of Admission:  08/30/2021    ? ? ?Total days of antibiotics 1  ? Vancomycin ? Pen G  ? ?      ?Reason for Consult: Group A Strep Bacteremia, polymicrobial arm abscesses  ?Referring Provider: [place]  ?Primary Care Provider: Richardean Chimera, MD  ? ? ?Assessment: ?Michele Mahoney is a 31 y.o. female with multiple abscesses on bilateral forearms and secondary bacteremia due to group a streptococcus.  ? ?Bacteremia - treated with linezolid + beta-lactam and now on PCN G and Vancomycin. WBC normalized and now afebrile. Now that she has cleared bacteremia probably no added benefit for continuing PCN G along with Vanc. Will continue vancomycin monotherapy for now given light growth of MRSA from superficial cultures.  ?Likely plan to treat with long acting IV oritavancin to treat her cellulitis/abscesses once we get good source control from surgery; probably easier for her to avoid taking pills after if we can.  ? ?Arm wounds w/ retained needle fragments - going to OR this afternoon for debridement.  ? ?B/L Knee pain and LE swelling - ? Reactive arthritis component given streptococcal bacteremia. On ketorolac now. Worse at night. No concern over septic knee with good ROM and no TTP.  ? ?Opioid Dependence - IVDU/Skin popping with heroin. Unclear if she is interested in MAT but would certainly benefit in an effort to reduce further infectious sequelae associated with IVDU. Will check acute hepatitis panel and RPR. HIV 4th generation was non-reactive this admission.  ?POC Pregnancy test negative.  ? ? ? ?Plan: ?Continue with vancomycin monotherapy ?Follow for OR findings ?RPR/Acute Hepatitis panel for next lab draw ? ? ?Principal Problem: ?  Arm abscess ?Active Problems: ?  Abscess of forearm ?  IVDU (intravenous drug user) ?  Group A streptococcal infection ? ? ? dicyclomine  10 mg Oral TID AC  ? enoxaparin (LOVENOX) injection  40 mg Subcutaneous Q24H  ? folic acid  1  mg Oral Daily  ? ibuprofen  800 mg Oral Once  ? multivitamin with minerals  1 tablet Oral Daily  ? mupirocin ointment  1 application. Nasal BID  ? thiamine  100 mg Oral Daily  ? Or  ? thiamine  100 mg Intravenous Daily  ? ? ?HPI: Michele Mahoney is a 31 y.o. female admitted for management of bilateral hand abscesses.  ? ?[name] has been seen a few times at [facility] and now here at [facility] for worsening bilateral arm swelling, rash and pain. Blood cultures on 4/18 did reveal streptococcus pyogenes bacteremia that resolved 4/20. She has left AMA previously but came back to [facility] for care and hospital admission. She does have a history of IV heroin use last 2d prior to hospitalization. Presented with withdrawal symptoms (anxiety, abdominal pain and diarrhea). CT scan and labs did not reveal any concern over compartment syndrome. Hand specialist saw the patient in ER and took her for I&D the next day - cultures growing abundant group A streptococcus and rare MRSA.  ?TTE negative.  ? ?Withdrawal symptoms managed with hydroxyzine, clonidine, bentyl.  ?Pain treated with toradol  ? ?Afebrile and WBC counts have normalized with antibiotics.  ? ? ?Review of Systems  ?Constitutional:  Negative for chills, fever, malaise/fatigue and weight loss.  ?HENT:  Negative for sore throat.   ?Respiratory:  Negative for cough, sputum production and shortness of breath.   ?Cardiovascular: Negative.   ?  Gastrointestinal:  Negative for abdominal pain, diarrhea and vomiting.  ?Musculoskeletal:  Negative for joint pain, myalgias and neck pain.  ?Skin:   ?     Open wounds on b/l forearms  ?Neurological:  Negative for headaches.  ?Psychiatric/Behavioral:  Negative for depression and substance abuse. The patient is not nervous/anxious.   ? ?History reviewed. No pertinent past medical history. ? ?Social History  ? ?Tobacco Use  ? Smoking status: Every Day  ?  Packs/day: 0.50  ?  Types: Cigarettes  ? Smokeless tobacco: Never  ?Vaping Use  ? Vaping Use:  Never used  ?Substance Use Topics  ? Alcohol use: No  ? Drug use: Yes  ?  Types: IV  ? ? ?Family History  ?Problem Relation Age of Onset  ? Hypertension Mother   ? Diabetes Mother   ? Diabetes Other   ? Hypertension Other   ? ?Allergies  ?Allergen Reactions  ? Penicillins Rash  ?  Pt tolerated amoxicillin (08/2021) and penicillin G (08/2021) without reaction ? ?Has patient had a PCN reaction causing immediate rash, facial/tongue/throat swelling, SOB or lightheadedness with hypotension: Yes ?Has patient had a PCN reaction causing severe rash involving mucus membranes or skin necrosis: No ?Has patient had a PCN reaction that required hospitalization No ?Has patient had a PCN reaction occurring within the last 10 years: No  ? Sulfa Antibiotics Rash  ? ? ?OBJECTIVE: ?Blood pressure 122/64, pulse (!) 107, temperature 99.1 ?F (37.3 ?C), temperature source Oral, resp. rate 16, height 5\' 4"  (1.626 m), weight 63.8 kg, SpO2 96 %. ? ?Physical Exam ?Constitutional:   ?   Appearance: Normal appearance.  ?HENT:  ?   Mouth/Throat:  ?   Mouth: Mucous membranes are moist.  ?   Pharynx: Oropharynx is clear.  ?Eyes:  ?   Pupils: Pupils are equal, round, and reactive to light.  ?Cardiovascular:  ?   Rate and Rhythm: Normal rate.  ?Pulmonary:  ?   Effort: Pulmonary effort is normal.  ?Musculoskeletal:     ?   General: Swelling present.  ?   Right lower leg: Edema present.  ?   Left lower leg: Edema present.  ?Skin: ?   General: Skin is warm and dry.  ?   Capillary Refill: Capillary refill takes less than 2 seconds.  ?   Comments: B/L upper extremities wrapped in gauze. There appears to be some significant swelling R>L  ?Neurological:  ?   Mental Status: She is alert and oriented to person, place, and time.  ? ? ?Lab Results ?Lab Results  ?Component Value Date  ? WBC 9.9 08/31/2021  ? HGB 8.4 (L) 08/31/2021  ? HCT 26.1 (L) 08/31/2021  ? MCV 94.2 08/31/2021  ? PLT 207 08/31/2021  ?  ?Lab Results  ?Component Value Date  ? CREATININE 0.55  08/31/2021  ? BUN <5 (L) 08/31/2021  ? NA 139 08/31/2021  ? K 3.8 08/31/2021  ? CL 108 08/31/2021  ? CO2 26 08/31/2021  ?  ?Lab Results  ?Component Value Date  ? ALT 19 08/30/2021  ? AST 20 08/30/2021  ? ALKPHOS 78 08/30/2021  ? BILITOT 0.1 (L) 08/30/2021  ?  ? ?Microbiology: ?Recent Results (from the past 240 hour(s))  ?Blood Culture (routine x 2)     Status: Abnormal  ? Collection Time: 08/24/21  1:24 PM  ? Specimen: BLOOD  ?Result Value Ref Range Status  ? Specimen Description   Final  ?  BLOOD BLOOD RIGHT ARM ?Performed at  [facility], [address] [place], [place] [address] ?  ? Special Requests   Final  ?  BOTTLES DRAWN AEROBIC AND ANAEROBIC Blood Culture adequate volume ?Performed at [facility], [address], [place], [place] [address] ?  ? Culture  Setup Time   Final  ?  GRAM POSITIVE COCCI ANAEROBIC AND AEROBIC BOTTLES Gram Stain Report Called to,Read Back By and Verified With: ONDARA,[email or web address]  BY MATTHEWS, B 4.19.2023 ?CRITICAL VALUE NOTED.  VALUE IS CONSISTENT WITH PREVIOUSLY REPORTED AND CALLED VALUE. ?  ? Culture (A)  Final  ?  GROUP A STREP (S.PYOGENES) ISOLATED ?HEALTH DEPARTMENT NOTIFIED ?SUSCEPTIBILITIES PERFORMED ON PREVIOUS CULTURE WITHIN THE LAST 5 DAYS. ?Performed at [facility], [address] [place], [place] [address] ?  ? Report Status 08/27/2021 FINAL  Final  ?Aerobic Culture w Gram Stain (superficial specimen)     Status: None  ? Collection Time: 08/24/21  1:25 PM  ? Specimen: Abscess  ?Result Value Ref Range Status  ? Specimen Description   Final  ?  ABSCESS LEFT ARM ?Performed at [facility], [address], [place], [place] [address] ?  ? Special Requests   Final  ?  NONE ?Performed at [facility], [address], [place], [place] [address] ?  ? Gram Stain   Final  ?  MODERATE WBC PRESENT,BOTH PMN AND MONONUCLEAR ?FEW GRAM POSITIVE COCCI IN PAIRS AND CHAINS ?Performed at [facility], [address], [place], [place] [address] ?  ? Culture   Final  ?  ABUNDANT  GROUP A STREP (S.PYOGENES) ISOLATED ?Beta hemolytic streptococci are predictably susceptible to penicillin and other beta lactams. Susceptibility testing not routinely performed. ?FEW METHICILLIN RESISTAN

## 2021-08-31 NOTE — Interval H&P Note (Signed)
History and Physical Interval Note: ? ?08/31/2021 ?2:39 PM ? ?Michele Mahoney  has presented today for surgery, with the diagnosis of infection.  The various methods of treatment have been discussed with the patient and family. After consideration of risks, benefits and other options for treatment, the patient has consented to  Procedure(s): ?IRRIGATION AND DEBRIDEMENT EXTREMITY (Bilateral) as a surgical intervention.  The patient's history has been reviewed, patient examined, no change in status, stable for surgery.  I have reviewed the patient's chart and labs.  Questions were answered to the patient's satisfaction.   ? ? ?Gomez Cleverly ? ? ?

## 2021-08-31 NOTE — H&P (View-Only) (Signed)
?Orthopedic Hand Surgery Consultation: ? ?Reason for Consult: BUE infection ?Referring Physician: Dr. Zhang ? ? ?HPI: Michele Mahoney is a(an) 31 y.o. female who presents with bilateral upper extremity infection open wounds and abscesses.  She admits to recent heroin use bilateral upper extremities has had multiple admissions and has left AMA previously.  Her imaging confirms abscesses to bilateral forearms and hands and wrists with radiopaque foreign bodies consistent with broken needles within her forearms.  She has been seen in the hospital previously but now has worsening of pain and swelling primarily to the left hand and wrist but also to the right forearm hand and wrist.  She has no systemic symptoms.  She lives at home with her mother.  She says she will try to stop using drugs when she leaves this hospital admission.  She has a number of some rehab facilities that she says she will reach out to. ? ? ?Physical Exam: ?Bilateral upper Extremity ?Multiple abscesses and open wounds throughout the bilateral upper extremities.  Worsening pain and swelling to the left wrist in comparison to the right.  She is able to weakly flex and extend the digits has intact sensation to light touch over the median radial and ulnar nerve distributions with brisk cap refill to fingertips.  Her upper arm has no pain or swelling to bilateral upper extremities. ? ? ?Assessment/Plan: ?Bilateral forearm wrist and hand abscesses from IV drug abuse.  Patient admits to recent heroin use CT scans and x-rays have confirmed multiple foreign body broken needles within the forearms and abscesses with ulcerations.  I was consulted for surgical debridement and incision and drainage of the deep abscesses of the forearm wrist and hand.  I discussed the risks and benefits with the patient including persistent infection, bleeding, injury to nerves blood vessels tendons and other neighboring structures, stiffness, pain, need for further surgery, need  for prolonged wound care and rehabilitation, stiffness of the digits and loss of function of the upper extremities.  Informed consent was obtained for bilateral upper extremity irrigation and debridement and all indicated procedures. ? ? ? ?J. Reid Zera Markwardt, MD ?Orthopaedic Hand Surgeon ?[facility] ?Office number: [phone] ?[address] ?[place], [place] [address] ? ? ? ?History reviewed. No pertinent past medical history. ? ?Past Surgical History:  ?Procedure Laterality Date  ? FRACTURE SURGERY    ? ? ?Family History  ?Problem Relation Age of Onset  ? Hypertension Mother   ? Diabetes Mother   ? Diabetes Other   ? Hypertension Other   ? ? ?Social History:  reports that she has been smoking cigarettes. She has been smoking an average of .5 packs per day. She has never used smokeless tobacco. She reports current drug use. Drug: IV. She reports that she does not drink alcohol. ? ?Allergies:  ?Allergies  ?Allergen Reactions  ? Penicillins Rash  ?  Pt tolerated amoxicillin (08/2021) and penicillin G (08/2021) without reaction ? ?Has patient had a PCN reaction causing immediate rash, facial/tongue/throat swelling, SOB or lightheadedness with hypotension: Yes ?Has patient had a PCN reaction causing severe rash involving mucus membranes or skin necrosis: No ?Has patient had a PCN reaction that required hospitalization No ?Has patient had a PCN reaction occurring within the last 10 years: No  ? Sulfa Antibiotics Rash  ? ? ?Medications: reviewed, no changes to patient's home medications ? ?Results for orders placed or performed during the hospital encounter of 08/30/21 (from the past 48 hour(s))  ?CBC with Differential       Status: Abnormal  ? Collection Time: 08/30/21 11:07 AM  ?Result Value Ref Range  ? WBC 12.4 (H) 4.0 - 10.5 K/uL  ? RBC 2.80 (L) 3.87 - 5.11 MIL/uL  ? Hemoglobin 8.6 (L) 12.0 - 15.0 g/dL  ? HCT 26.2 (L) 36.0 - 46.0 %  ? MCV 93.6 80.0 - 100.0 fL  ? MCH 30.7 26.0 - 34.0 pg  ? MCHC 32.8 30.0 -  36.0 g/dL  ? RDW 14.7 11.5 - 15.5 %  ? Platelets 238 150 - 400 K/uL  ? nRBC 0.2 0.0 - 0.2 %  ? Neutrophils Relative % 51 %  ? Neutro Abs 6.4 1.7 - 7.7 K/uL  ? Lymphocytes Relative 38 %  ? Lymphs Abs 4.7 (H) 0.7 - 4.0 K/uL  ? Monocytes Relative 6 %  ? Monocytes Absolute 0.7 0.1 - 1.0 K/uL  ? Eosinophils Relative 3 %  ? Eosinophils Absolute 0.4 0.0 - 0.5 K/uL  ? Basophils Relative 0 %  ? Basophils Absolute 0.0 0.0 - 0.1 K/uL  ? Immature Granulocytes 2 %  ? Abs Immature Granulocytes 0.24 (H) 0.00 - 0.07 K/uL  ?  Comment: Performed at [facility], [place] [address], [place], [place] [address]  ?Lactic acid, plasma     Status: None  ? Collection Time: 08/30/21 11:07 AM  ?Result Value Ref Range  ? Lactic Acid, Venous 1.7 0.5 - 1.9 mmol/L  ?  Comment: Performed at [facility], [place] [address] [place], [place] [address]  ?Comprehensive metabolic panel     Status: Abnormal  ? Collection Time: 08/30/21 11:07 AM  ?Result Value Ref Range  ? Sodium 139 135 - 145 mmol/L  ? Potassium 3.6 3.5 - 5.1 mmol/L  ? Chloride 105 98 - 111 mmol/L  ? CO2 27 22 - 32 mmol/L  ? Glucose, Bld 97 70 - 99 mg/dL  ?  Comment: Glucose reference range applies only to samples taken after fasting for at least 8 hours.  ? BUN 5 (L) 6 - 20 mg/dL  ? Creatinine, Ser 0.55 0.44 - 1.00 mg/dL  ? Calcium 8.4 (L) 8.9 - 10.3 mg/dL  ? Total Protein 6.6 6.5 - 8.1 g/dL  ? Albumin 2.4 (L) 3.5 - 5.0 g/dL  ? AST 20 15 - 41 U/L  ? ALT 19 0 - 44 U/L  ? Alkaline Phosphatase 78 38 - 126 U/L  ? Total Bilirubin 0.1 (L) 0.3 - 1.2 mg/dL  ? GFR, Estimated >60 >60 mL/min  ?  Comment: (NOTE) ?Calculated using the CKD-EPI Creatinine Equation (2021) ?  ? Anion gap 7 5 - 15  ?  Comment: Performed at [facility], [place] [address], [place], [place] [address]  ?CK     Status: None  ? Collection Time: 08/30/21  7:26 PM  ?Result Value Ref Range  ? Total CK 89 38 - 234 U/L  ?  Comment: Performed at [facility], [place] [address], [place], [place] [address]  ?Protime-INR      Status: None  ? Collection Time: 08/30/21  7:26 PM  ?Result Value Ref Range  ? Prothrombin Time 12.1 11.4 - 15.2 seconds  ? INR 0.9 0.8 - 1.2  ?  Comment: (NOTE) ?INR goal varies based on device and disease states. ?Performed at [facility], [place] [address], [place], [place] ?[address] ?  ?Lactic acid, plasma     Status: None  ? Collection Time: 08/30/21  8:08 PM  ?Result Value  Ref Range  ? Lactic Acid, Venous 1.0 0.5 - 1.9 mmol/L  ?  Comment: Performed at [facility], [place] [address], [place], [place] [address]  ?Surgical PCR screen     Status: None  ? Collection Time: 08/31/21  1:50 AM  ? Specimen: Nasal Mucosa; Nasal Swab  ?Result Value Ref Range  ? MRSA, PCR NEGATIVE NEGATIVE  ? Staphylococcus aureus NEGATIVE NEGATIVE  ?  Comment: (NOTE) ?The Xpert SA Assay (FDA approved for NASAL specimens in patients 49 ?years of age and older), is one component of a comprehensive ?surveillance program. It is not intended to diagnose infection nor to ?guide or monitor treatment. ?Performed at [facility], [place] [address], [place], [place] ?[address] ?  ?CBC     Status: Abnormal  ? Collection Time: 08/31/21  2:08 AM  ?Result Value Ref Range  ? WBC 9.9 4.0 - 10.5 K/uL  ? RBC 2.77 (L) 3.87 - 5.11 MIL/uL  ? Hemoglobin 8.4 (L) 12.0 - 15.0 g/dL  ? HCT 26.1 (L) 36.0 - 46.0 %  ? MCV 94.2 80.0 - 100.0 fL  ? MCH 30.3 26.0 - 34.0 pg  ? MCHC 32.2 30.0 - 36.0 g/dL  ? RDW 14.9 11.5 - 15.5 %  ? Platelets 207 150 - 400 K/uL  ? nRBC 0.2 0.0 - 0.2 %  ?  Comment: Performed at [facility], [place] [address] [place], [place] [address]  ?Basic metabolic panel     Status: Abnormal  ? Collection Time: 08/31/21  2:08 AM  ?Result Value Ref Range  ? Sodium 139 135 - 145 mmol/L  ? Potassium 3.8 3.5 - 5.1 mmol/L  ? Chloride 108 98 - 111 mmol/L  ? CO2 26 22 - 32 mmol/L  ? Glucose, Bld 99 70 - 99 mg/dL  ?  Comment: Glucose reference range applies only to samples taken after fasting for at least 8 hours.  ? BUN <5 (L) 6 - 20 mg/dL  ?  Creatinine, Ser 0.55 0.44 - 1.00 mg/dL  ? Calcium 8.6 (L) 8.9 - 10.3 mg/dL  ? GFR, Estimated >60 >60 mL/min  ?  Comment: (NOTE) ?Calculated using the CKD-EPI Creatinine Equation (2021) ?  ? Anion gap

## 2021-08-31 NOTE — Anesthesia Preprocedure Evaluation (Addendum)
Anesthesia Evaluation  ?Patient identified by MRN, date of birth, ID band ?Patient awake ? ? ? ?Reviewed: ?Allergy & Precautions, H&P , NPO status , Patient's Chart, lab work & pertinent test results ? ?Airway ?Mallampati: III ? ?TM Distance: >3 FB ?Neck ROM: Full ? ? ? Dental ?no notable dental hx. ?(+) Poor Dentition, Dental Advisory Given ?  ?Pulmonary ?Current Smoker and Patient abstained from smoking.,  ?  ?Pulmonary exam normal ?breath sounds clear to auscultation ? ? ? ? ? ? Cardiovascular ?negative cardio ROS ? ? ?Rhythm:Regular Rate:Normal ? ? ?  ?Neuro/Psych ?negative neurological ROS ? negative psych ROS  ? GI/Hepatic ?negative GI ROS, (+)  ?  ? substance abuse ? IV drug use,   ?Endo/Other  ?negative endocrine ROS ? Renal/GU ?negative Renal ROS  ?negative genitourinary ?  ?Musculoskeletal ? ? Abdominal ?  ?Peds ? Hematology ?negative hematology ROS ?(+)   ?Anesthesia Other Findings ? ? Reproductive/Obstetrics ?negative OB ROS ? ?  ? ? ? ? ? ? ? ? ? ? ? ? ? ?  ?  ? ? ? ? ? ? ? ?Anesthesia Physical ?Anesthesia Plan ? ?ASA: 2 ? ?Anesthesia Plan: General  ? ?Post-op Pain Management: Ketamine IV*  ? ?Induction: Intravenous ? ?PONV Risk Score and Plan: 3 and Midazolam, Dexamethasone and Ondansetron ? ?Airway Management Planned: LMA ? ?Additional Equipment:  ? ?Intra-op Plan:  ? ?Post-operative Plan: Extubation in OR ? ?Informed Consent: I have reviewed the patients History and Physical, chart, labs and discussed the procedure including the risks, benefits and alternatives for the proposed anesthesia with the patient or authorized representative who has indicated his/her understanding and acceptance.  ? ? ? ?Dental advisory given ? ?Plan Discussed with: CRNA ? ?Anesthesia Plan Comments:   ? ? ? ? ? ?Anesthesia Quick Evaluation ? ?

## 2021-08-31 NOTE — Progress Notes (Signed)
Postop Plan: ? ?Ok to use BUE as tolerated ?Keep elevated as best as possible ?Perform dressing changes as needed- these wounds will likely continue to drain and will need to be changed frequently due to severity of infection ?- dressings are non-adherent, 4x4 and loosely applied kerlix ?Continue to work on finger ROM as much as possible to avoid contracture ?Continue IV abx, f/u intra-op cultures ?No further surgery planned for now, will continue to monitor for resolution of symptoms ? ?J. Standley Dakins, MD ?Orthopaedic Hand Surgeon ? ?

## 2021-08-31 NOTE — Op Note (Addendum)
OPERATIVE NOTE ? ?DATE OF PROCEDURE: 08/31/2021 ? ?SURGEONS:  ?Primary: Orene Desanctis, MD ? ?PREOPERATIVE DIAGNOSIS: infection to bilateral hand, wrist and forearms from intravenous drug use ? ?POSTOPERATIVE DIAGNOSIS: Same ? ?NAME OF PROCEDURE:  ? ?Right wrist, dorsal forearm, hand I&D deep abscess ?Left wrist and forearm I&D deep abscess ?Left hand carpal tunnel release and I&D deep space infection of hand ? ?ANESTHESIA: General ? ?SKIN PREPARATION: Hibiclens ? ?ESTIMATED BLOOD LOSS: Minimal ? ?IMPLANTS: none ? ?INDICATIONS:  Michele Mahoney is a 31 y.o. female who has the above preoperative diagnosis. The patient has decided to proceed with surgical intervention.  Risks, benefits and alternatives of operative management were discussed including, but not limited to, risks of anesthesia complications, infection, pain, persistent symptoms, stiffness, need for future surgery.  The patient understands, agrees and elects to proceed with surgery.   ? ?DESCRIPTION OF PROCEDURE: The patient was met in the pre-operative area and their identity was verified.  The operative location and laterality was also verified and marked.  The patient was brought to the OR and was placed supine on the table.  After repeat patient identification with the operative team anesthesia was provided and the patient was prepped and draped in the usual sterile fashion.  A final timeout was performed verifying the correction patient, procedure, location and laterality. ? ?The bilateral upper extremities were prepped and draped in usual sterile fashion and beginning with the left upper extremity it was elevated and tourniquet inflated to 250 mmHg.  An incision in the hand was made in the palm over the carpal tunnel.  Palmar fascia was identified and the transverse carpal ligament was divided.  Deep to this there was significant purulent infection within the carpal tunnel.  Irrigation and debridement was then performed. There was sigificant compression of the  median nerve within the carpal tunnel due to infection within this confined space. The median nerve was decompressed and tenosynovectomy was performed within the carpal tunnel. An incision was then made over the volar forearm in curvilinear fashion.  The skin and subcutaneous tissues were elevated and the volar forearm and mobile wad fascia was identified.  The fascia was then released completely and deep to the flexor tendons there was a deep space infection within the wrist and the forearm of the left upper extremity.  An incision and drainage of the deep abscess was performed in these areas.  Of all 3 areas and excisional debridement was performed of multiple wounds in addition to the dorsal aspect of the hand where there were multiple wounds. The total square centimeters of wounds to left upper extremity that were irrigated and debrided of skin subcutaneous tissue was 50 square centimeters. These were scattered throughout the entire left upper extremity.  A curette was utilized as well as a 15 blade scalpel for excisional sharp debridement.  At this time the incisions and wounds were thoroughly irrigated with 3 L of normal saline via low flow cystoscopy tubing.  2-0 PDS suture was utilized for loose approximation of the skin edges to cover the median nerve and tendon areas.  A sterile soft bandage was applied and the tourniquet was deflated and fingers were pink and warm and well-perfused at the end of the procedure.  At this time attention was turned to the right upper extremity.  The right upper extremity was elevated and tourniquet inflated to 250 mmHg.  Over the dorsal aspect there were multiple wounds that were identified and a dorsal abscess within the extensor musculature in the  deep portion of the wrist and forearm as well as the hand.  Longitudinal incision was made over the second metacarpal as well as over the right wrist and over the dorsal forearm.  Skin and subcutaneous tissues were divided and  purulent material was identified.  This was evacuated and drained.  Excisional debridement of multiple wounds was performed with a curette and a 15 blade scalpel.  An excisional sharp debridement was performed to all of these wounds.  The hand wrist and forearm was then thoroughly irrigated with 3 L of normal saline via low flow cystoscopy tubing. The total square centimeters of wounds to right upper extremity that were irrigated and debrided of skin subcutaneous tissue was 40 square centimeters on the right upper extremity.    At this time the dorsal forearm and hand incisions were loosely approximated with 2-0 PDS suture.  A sterile soft bandage was applied and the tourniquet was deflated and the fingers were pink and warm and well-perfused.  At this time all counts were correct x2 the patient was awoken from anesthesia and brought to PACU for recovery in stable condition. ? ? ?Matt Holmes, MD ? ?

## 2021-08-31 NOTE — Plan of Care (Signed)

## 2021-08-31 NOTE — Transfer of Care (Signed)
Immediate Anesthesia Transfer of Care Note ? ?Patient: Michele Mahoney ? ?Procedure(s) Performed: IRRIGATION AND DEBRIDEMENT EXTREMITY (Bilateral: Arm Lower) ? ?Patient Location: PACU ? ?Anesthesia Type:General ? ?Level of Consciousness: lethargic ? ?Airway & Oxygen Therapy: Patient Spontanous Breathing and Patient connected to face mask oxygen ? ?Post-op Assessment: Report given to RN ? ?Post vital signs: Reviewed and stable ? ?Last Vitals:  ?Vitals Value Taken Time  ?BP 111/69 08/31/21 1657  ?Temp    ?Pulse 85 08/31/21 1657  ?Resp 20 08/31/21 1657  ?SpO2 100 % 08/31/21 1657  ?Vitals shown include unvalidated device data. ? ?Last Pain:  ?Vitals:  ? 08/31/21 1354  ?TempSrc:   ?PainSc: 8   ?   ? ?Patients Stated Pain Goal: 3 (08/31/21 1354) ? ?Complications: No notable events documented. ?

## 2021-08-31 NOTE — Progress Notes (Signed)
Left arm dressing reinforced-due to bleed through. ?

## 2021-08-31 NOTE — Progress Notes (Addendum)
?PROGRESS NOTE ? ? ? ?Michele Mahoney  WER:[id number] DOB: 02/03/1991 DOA: 08/30/2021 ?PCP: Michele Chimera, MD  ? ? ?Brief Narrative:  ? ?Michele Mahoney is a 31 year old female with past medical history significant for current IV DU with recently diagnosed bilateral forearm abscesses and deep tissue infection with strep pyogenes and strep pyogenes septicemia presented to [facility] ED on 4/24 seeking further medical assistance.  Patient continues with bilateral arm swelling and pain.  Reports one-time dose of IV heroin 2 days ago.  Also has been snorting cocaine due to inability to find venous access. ? ?Patient went to [facility] on 04/18 for worsening of bilateral arm swelling, rash and pain, she was worked up for bilateral forearm abscesses and deep tissue infection and was supposed to come to [facility] for evaluation by hand surgery for incision and drainage.  Patient left AMA on 08/27/2021. ? ?In the ED, temperature 97.7 ?F, HR 105, RR 16, BP 122/81, SPO2 100% on room air.  WBC 12.4, hemoglobin 8.6, platelets 238.  Sodium 139, potassium 3.6, chloride 105, CO2 27, glucose 97, BUN 5, creatinine 0.55.  AST 20, ALT 19, total bilirubin 0.1.  Lactic acid 1.7.  CT right hand/forearm with soft tissue edema right forearm/hand with skin thickening consistent with cellulitis, 6.7 x 1.3 x 6.4 cm fluid collection along the superficial aspect distal musculature forearm concerning for abscess, complex fluid collection subcutaneous fat dorsal aspect wrist 8 x 8 x 28 mm concerning for abscess, 1.1 x 0.8 x 19 mm complex fluid collection overlying olecranon consistent with abscess, stable 15 mm linear foreign body subcutaneous fat mid dorsal forearm with portion embedded in the muscle.  CT left hand/forearm worsening flexor tenosynovitis extending from proximal left forearm into palmar aspect of the hand suspicious for infectious tenosynovitis, no other focal fluid collections identified, stable superficial linear metallic foreign  body within the antecubital fossa. ? ?Assessment & Plan: ?  ?Streptococcus pyogenes septicemia ?Left wrist tenosynovitis ?Multiple abscesses bilateral upper extremities with foreign body ?Patient presenting to ED after recently leaving AMA from [facility] on 4/21 with continued pain and swelling to bilateral forearms.  Patient was diagnosed with Streptococcus pyogenes septicemia with multiple abscesses to bilateral upper extremities with foreign bodies present.  TTE 08/27/2021 with no evidence of valvular vegetations. ?--Orthopedic hand surgery and infectious disease following, appreciate assistance ?--Blood cultures x2 4/24: Pending ?--Vancomycin, pharmacy consulted for dosing/monitoring ?--N.p.o. for planned irrigation and debridement today by orthopedics, Michele Mahoney ? ?Active IV drug abuse ?Continues with current IV drug abuse/skin popping with heroin.  Counseled on need for complete cessation. ?--Clonidine 0.1 mg TID for anxiety/withdrawal symptoms ?--Ativan/hydroxyzine as needed ?--TOC for substance abuse resources ? ? ?DVT prophylaxis: enoxaparin (LOVENOX) injection 40 mg Start: 08/30/21 2200 ? ?  Code Status: Full Code ?Family Communication: updated patients mother, Michele Mahoney by telephone this afternoon ? ?Disposition Plan:  ?Level of care: Med-Surg ?Status is: Inpatient ?Remains inpatient appropriate because: Pending irrigation and debridement by orthopedics today, IV antibiotics ?  ? ?Consultants:  ?Orthopedic hand surgery, Michele Mahoney ?Infectious disease ? ?Procedures:  ?Pending I&D of multiple abscesses today by orthopedics ? ?Antimicrobials:  ?Vancomycin ?Penicillin G ? ? ?Subjective: ?Patient seen examined bedside, resting comfortably.  Pain currently controlled.  Discussed with patient regarding plan for irrigation debridement of multiple abscesses today.  Reports pain currently controlled.  No other specific questions or concerns at this time.  Denies headache, no chest pain, no palpitations, no  fever/chills/night sweats, no nausea/vomiting/diarrhea, no cough/congestion,  no weakness, no fatigue, no paresthesias.  No acute events overnight per nurse staff. ? ?Objective: ?Vitals:  ? 08/31/21 0050 08/31/21 6433 08/31/21 0615 08/31/21 0810  ?BP: 122/77 105/63 110/64 122/64  ?Pulse: (!) 109 (!) 102 (!) 106 (!) 107  ?Resp: 17 14  16   ?Temp: 98.5 ?F (36.9 ?C) 98.7 ?F (37.1 ?C)  99.1 ?F (37.3 ?C)  ?TempSrc: Oral Oral  Oral  ?SpO2: 100%   96%  ?Weight: 63.8 kg     ?Height: 5\' 4"  (1.626 m)     ? ? ?Intake/Output Summary (Last 24 hours) at 08/31/2021 1336 ?Last data filed at 08/30/2021 1600 ?Gross per 24 hour  ?Intake 1942.88 ml  ?Output --  ?Net 1942.88 ml  ? ?Filed Weights  ? 08/31/21 0050  ?Weight: 63.8 kg  ? ? ?Examination: ? ?Physical Exam: ?GEN: NAD, alert and oriented x 3, chronically ill in appearance, appears older than stated age, poor dentition ?HEENT: NCAT, PERRL, EOMI, sclera clear, MMM ?PULM: CTAB w/o wheezes/crackles, normal respiratory effort, on room air ?CV: RRR w/o M/G/R ?GI: abd soft, NTND, NABS, no R/G/M ?MSK: no peripheral edema, moves all extremities independently ?NEURO: CN II-XII intact, no focal deficits, sensation to light touch intact ?PSYCH: normal mood/affect ?Integumentary: Multiple open wounds on bilateral forearms with swelling ? ? ? ? ? ? ?Data Reviewed: I have personally reviewed following labs and imaging studies ? ?CBC: ?Recent Labs  ?Lab 08/25/21 ?0620 08/26/21 ?0739 08/27/21 ?08/28/21 08/30/21 ?1107 08/31/21 ?0208  ?WBC 17.9* 20.2* 15.5* 12.4* 9.9  ?NEUTROABS  --   --   --  6.4  --   ?HGB 9.6* 8.6* 8.5* 8.6* 8.4*  ?HCT 30.2* 25.1* 24.9* 26.2* 26.1*  ?MCV 94.4 87.2 89.2 93.6 94.2  ?PLT 200 268 231 238 207  ? ?Basic Metabolic Panel: ?Recent Labs  ?Lab 08/24/21 ?1510 08/25/21 ?08/26/21 08/26/21 ?8841 08/27/21 ?6606 08/30/21 ?1107 08/31/21 ?09/01/21  ?NA  --  132* 137 138 139 139  ?K  --  5.2* 4.1 5.2* 3.6 3.8  ?CL  --  108 109 110 105 108  ?CO2  --  17* 20* 21* 27 26  ?GLUCOSE  --  98 94 100* 97  99  ?BUN  --  10 7 8  5* <5*  ?CREATININE  --  0.52 0.49 0.59 0.55 0.55  ?CALCIUM  --  7.6* 7.6* 8.3* 8.4* 8.6*  ?MG 2.2  --   --   --   --   --   ?PHOS 2.7  --   --   --   --   --   ? ?GFR: ?Estimated Creatinine Clearance: 88 mL/min (by C-G formula based on SCr of 0.55 mg/dL). ?Liver Function Tests: ?Recent Labs  ?Lab 08/30/21 ?1107  ?AST 20  ?ALT 19  ?ALKPHOS 78  ?BILITOT 0.1*  ?PROT 6.6  ?ALBUMIN 2.4*  ? ?No results for input(s): LIPASE, AMYLASE in the last 168 hours. ?No results for input(s): AMMONIA in the last 168 hours. ?Coagulation Profile: ?Recent Labs  ?Lab 08/30/21 ?1926  ?INR 0.9  ? ?Cardiac Enzymes: ?Recent Labs  ?Lab 08/30/21 ?1926  ?CKTOTAL 89  ? ?BNP (last 3 results) ?No results for input(s): PROBNP in the last 8760 hours. ?HbA1C: ?No results for input(s): HGBA1C in the last 72 hours. ?CBG: ?No results for input(s): GLUCAP in the last 168 hours. ?Lipid Profile: ?No results for input(s): CHOL, HDL, LDLCALC, TRIG, CHOLHDL, LDLDIRECT in the last 72 hours. ?Thyroid Function Tests: ?No results for input(s): TSH, T4TOTAL, FREET4, T3FREE, THYROIDAB  in the last 72 hours. ?Anemia Panel: ?No results for input(s): VITAMINB12, FOLATE, FERRITIN, TIBC, IRON, RETICCTPCT in the last 72 hours. ?Sepsis Labs: ?Recent Labs  ?Lab 08/24/21 ?1510 08/30/21 ?1107 08/30/21 ?2008  ?LATICACIDVEN 3.0* 1.7 1.0  ? ? ?Recent Results (from the past 240 hour(s))  ?Blood Culture (routine x 2)     Status: Abnormal  ? Collection Time: 08/24/21  1:24 PM  ? Specimen: BLOOD  ?Result Value Ref Range Status  ? Specimen Description   Final  ?  BLOOD BLOOD RIGHT ARM ?Performed at [facility], [address] [place], [place] [address] ?  ? Special Requests   Final  ?  BOTTLES DRAWN AEROBIC AND ANAEROBIC Blood Culture adequate volume ?Performed at [facility], [address] [place], [place] [address] ?  ? Culture  Setup Time   Final  ?  GRAM POSITIVE COCCI ANAEROBIC AND AEROBIC BOTTLES Gram Stain Report Called to,Read Back By and Verified  With: ONDARA,M@[email or web address]  BY MATTHEWS, B 4.19.2023 ?CRITICAL VALUE NOTED.  VALUE IS CONSISTENT WITH PREVIOUSLY REPORTED AND CALLED VALUE. ?  ? Culture (A)  Final  ?  GROUP A STREP (S.PYOGENES) ISOLATED ?HEALTH DEP

## 2021-08-31 NOTE — Anesthesia Procedure Notes (Signed)
Procedure Name: LMA Insertion ?Date/Time: 08/31/2021 2:53 PM ?Performed by: Maxine Glenn, CRNA ?Pre-anesthesia Checklist: Patient identified, Emergency Drugs available, Suction available and Patient being monitored ?Patient Re-evaluated:Patient Re-evaluated prior to induction ?Oxygen Delivery Method: Circle System Utilized ?Preoxygenation: Pre-oxygenation with 100% oxygen ?Induction Type: IV induction ?Ventilation: Mask ventilation without difficulty ?LMA: LMA inserted ?LMA Size: 4.0 ?Number of attempts: 1 ?Airway Equipment and Method: Bite block ?Placement Confirmation: positive ETCO2 ?Tube secured with: Tape ?Dental Injury: Teeth and Oropharynx as per pre-operative assessment  ? ? ? ? ?

## 2021-08-31 NOTE — Anesthesia Postprocedure Evaluation (Signed)
Anesthesia Post Note ? ?Patient: Atziri Zubiate ? ?Procedure(s) Performed: IRRIGATION AND DEBRIDEMENT EXTREMITY (Bilateral: Arm Lower) ? ?  ? ?Patient location during evaluation: PACU ?Anesthesia Type: General ?Level of consciousness: awake and alert ?Pain management: pain level controlled ?Vital Signs Assessment: post-procedure vital signs reviewed and stable ?Respiratory status: spontaneous breathing, nonlabored ventilation and respiratory function stable ?Cardiovascular status: blood pressure returned to baseline and stable ?Postop Assessment: no apparent nausea or vomiting ?Anesthetic complications: no ? ? ?No notable events documented. ? ?Last Vitals:  ?Vitals:  ? 08/31/21 1730 08/31/21 1745  ?BP: 136/78 128/66  ?Pulse: 84 85  ?Resp: 14 20  ?Temp:  36.7 ?C  ?SpO2: 100% 100%  ?  ?Last Pain:  ?Vitals:  ? 08/31/21 1730  ?TempSrc:   ?PainSc: 4   ? ? ?  ?  ?  ?  ?  ?  ? ?Minami Arriaga,W. EDMOND ? ? ? ? ?

## 2021-08-31 NOTE — Consult Note (Signed)
?Orthopedic Hand Surgery Consultation: ? ?Reason for Consult: BUE infection ?Referring Physician: Dr. Chipper Herb ? ? ?HPI: Michele Mahoney is a(an) 31 y.o. female who presents with bilateral upper extremity infection open wounds and abscesses.  She admits to recent heroin use bilateral upper extremities has had multiple admissions and has left AMA previously.  Her imaging confirms abscesses to bilateral forearms and hands and wrists with radiopaque foreign bodies consistent with broken needles within her forearms.  She has been seen in the hospital previously but now has worsening of pain and swelling primarily to the left hand and wrist but also to the right forearm hand and wrist.  She has no systemic symptoms.  She lives at home with her mother.  She says she will try to stop using drugs when she leaves this hospital admission.  She has a number of some rehab facilities that she says she will reach out to. ? ? ?Physical Exam: ?Bilateral upper Extremity ?Multiple abscesses and open wounds throughout the bilateral upper extremities.  Worsening pain and swelling to the left wrist in comparison to the right.  She is able to weakly flex and extend the digits has intact sensation to light touch over the median radial and ulnar nerve distributions with brisk cap refill to fingertips.  Her upper arm has no pain or swelling to bilateral upper extremities. ? ? ?Assessment/Plan: ?Bilateral forearm wrist and hand abscesses from IV drug abuse.  Patient admits to recent heroin use CT scans and x-rays have confirmed multiple foreign body broken needles within the forearms and abscesses with ulcerations.  I was consulted for surgical debridement and incision and drainage of the deep abscesses of the forearm wrist and hand.  I discussed the risks and benefits with the patient including persistent infection, bleeding, injury to nerves blood vessels tendons and other neighboring structures, stiffness, pain, need for further surgery, need  for prolonged wound care and rehabilitation, stiffness of the digits and loss of function of the upper extremities.  Informed consent was obtained for bilateral upper extremity irrigation and debridement and all indicated procedures. ? ? ? ?J. Standley Dakins, MD ?Orthopaedic Hand Surgeon ?[facility] ?Office number: [phone] ?[address] ?[place], [place] [address] ? ? ? ?History reviewed. No pertinent past medical history. ? ?Past Surgical History:  ?Procedure Laterality Date  ? FRACTURE SURGERY    ? ? ?Family History  ?Problem Relation Age of Onset  ? Hypertension Mother   ? Diabetes Mother   ? Diabetes Other   ? Hypertension Other   ? ? ?Social History:  reports that she has been smoking cigarettes. She has been smoking an average of .5 packs per day. She has never used smokeless tobacco. She reports current drug use. Drug: IV. She reports that she does not drink alcohol. ? ?Allergies:  ?Allergies  ?Allergen Reactions  ? Penicillins Rash  ?  Pt tolerated amoxicillin (08/2021) and penicillin G (08/2021) without reaction ? ?Has patient had a PCN reaction causing immediate rash, facial/tongue/throat swelling, SOB or lightheadedness with hypotension: Yes ?Has patient had a PCN reaction causing severe rash involving mucus membranes or skin necrosis: No ?Has patient had a PCN reaction that required hospitalization No ?Has patient had a PCN reaction occurring within the last 10 years: No  ? Sulfa Antibiotics Rash  ? ? ?Medications: reviewed, no changes to patient's home medications ? ?Results for orders placed or performed during the hospital encounter of 08/30/21 (from the past 48 hour(s))  ?CBC with Differential  Status: Abnormal  ? Collection Time: 08/30/21 11:07 AM  ?Result Value Ref Range  ? WBC 12.4 (H) 4.0 - 10.5 K/uL  ? RBC 2.80 (L) 3.87 - 5.11 MIL/uL  ? Hemoglobin 8.6 (L) 12.0 - 15.0 g/dL  ? HCT 26.2 (L) 36.0 - 46.0 %  ? MCV 93.6 80.0 - 100.0 fL  ? MCH 30.7 26.0 - 34.0 pg  ? MCHC 32.8 30.0 -  36.0 g/dL  ? RDW 14.7 11.5 - 15.5 %  ? Platelets 238 150 - 400 K/uL  ? nRBC 0.2 0.0 - 0.2 %  ? Neutrophils Relative % 51 %  ? Neutro Abs 6.4 1.7 - 7.7 K/uL  ? Lymphocytes Relative 38 %  ? Lymphs Abs 4.7 (H) 0.7 - 4.0 K/uL  ? Monocytes Relative 6 %  ? Monocytes Absolute 0.7 0.1 - 1.0 K/uL  ? Eosinophils Relative 3 %  ? Eosinophils Absolute 0.4 0.0 - 0.5 K/uL  ? Basophils Relative 0 %  ? Basophils Absolute 0.0 0.0 - 0.1 K/uL  ? Immature Granulocytes 2 %  ? Abs Immature Granulocytes 0.24 (H) 0.00 - 0.07 K/uL  ?  Comment: Performed at [facility], [place] [address], [place], [place] [address]  ?Lactic acid, plasma     Status: None  ? Collection Time: 08/30/21 11:07 AM  ?Result Value Ref Range  ? Lactic Acid, Venous 1.7 0.5 - 1.9 mmol/L  ?  Comment: Performed at [facility], [place] [address] [place], [place] [address]  ?Comprehensive metabolic panel     Status: Abnormal  ? Collection Time: 08/30/21 11:07 AM  ?Result Value Ref Range  ? Sodium 139 135 - 145 mmol/L  ? Potassium 3.6 3.5 - 5.1 mmol/L  ? Chloride 105 98 - 111 mmol/L  ? CO2 27 22 - 32 mmol/L  ? Glucose, Bld 97 70 - 99 mg/dL  ?  Comment: Glucose reference range applies only to samples taken after fasting for at least 8 hours.  ? BUN 5 (L) 6 - 20 mg/dL  ? Creatinine, Ser 0.55 0.44 - 1.00 mg/dL  ? Calcium 8.4 (L) 8.9 - 10.3 mg/dL  ? Total Protein 6.6 6.5 - 8.1 g/dL  ? Albumin 2.4 (L) 3.5 - 5.0 g/dL  ? AST 20 15 - 41 U/L  ? ALT 19 0 - 44 U/L  ? Alkaline Phosphatase 78 38 - 126 U/L  ? Total Bilirubin 0.1 (L) 0.3 - 1.2 mg/dL  ? GFR, Estimated >60 >60 mL/min  ?  Comment: (NOTE) ?Calculated using the CKD-EPI Creatinine Equation (2021) ?  ? Anion gap 7 5 - 15  ?  Comment: Performed at [facility], [place] [address], [place], [place] [address]  ?CK     Status: None  ? Collection Time: 08/30/21  7:26 PM  ?Result Value Ref Range  ? Total CK 89 38 - 234 U/L  ?  Comment: Performed at [facility], [place] [address], [place], [place] [address]  ?Protime-INR      Status: None  ? Collection Time: 08/30/21  7:26 PM  ?Result Value Ref Range  ? Prothrombin Time 12.1 11.4 - 15.2 seconds  ? INR 0.9 0.8 - 1.2  ?  Comment: (NOTE) ?INR goal varies based on device and disease states. ?Performed at [facility], [place] [address], [place], [place] ?[address] ?  ?Lactic acid, plasma     Status: None  ? Collection Time: 08/30/21  8:08 PM  ?Result Value  Ref Range  ? Lactic Acid, Venous 1.0 0.5 - 1.9 mmol/L  ?  Comment: Performed at [facility], [place] [address], [place], [place] [address]  ?Surgical PCR screen     Status: None  ? Collection Time: 08/31/21  1:50 AM  ? Specimen: Nasal Mucosa; Nasal Swab  ?Result Value Ref Range  ? MRSA, PCR NEGATIVE NEGATIVE  ? Staphylococcus aureus NEGATIVE NEGATIVE  ?  Comment: (NOTE) ?The Xpert SA Assay (FDA approved for NASAL specimens in patients 49 ?years of age and older), is one component of a comprehensive ?surveillance program. It is not intended to diagnose infection nor to ?guide or monitor treatment. ?Performed at [facility], [place] [address], [place], [place] ?[address] ?  ?CBC     Status: Abnormal  ? Collection Time: 08/31/21  2:08 AM  ?Result Value Ref Range  ? WBC 9.9 4.0 - 10.5 K/uL  ? RBC 2.77 (L) 3.87 - 5.11 MIL/uL  ? Hemoglobin 8.4 (L) 12.0 - 15.0 g/dL  ? HCT 26.1 (L) 36.0 - 46.0 %  ? MCV 94.2 80.0 - 100.0 fL  ? MCH 30.3 26.0 - 34.0 pg  ? MCHC 32.2 30.0 - 36.0 g/dL  ? RDW 14.9 11.5 - 15.5 %  ? Platelets 207 150 - 400 K/uL  ? nRBC 0.2 0.0 - 0.2 %  ?  Comment: Performed at [facility], [place] [address] [place], [place] [address]  ?Basic metabolic panel     Status: Abnormal  ? Collection Time: 08/31/21  2:08 AM  ?Result Value Ref Range  ? Sodium 139 135 - 145 mmol/L  ? Potassium 3.8 3.5 - 5.1 mmol/L  ? Chloride 108 98 - 111 mmol/L  ? CO2 26 22 - 32 mmol/L  ? Glucose, Bld 99 70 - 99 mg/dL  ?  Comment: Glucose reference range applies only to samples taken after fasting for at least 8 hours.  ? BUN <5 (L) 6 - 20 mg/dL  ?  Creatinine, Ser 0.55 0.44 - 1.00 mg/dL  ? Calcium 8.6 (L) 8.9 - 10.3 mg/dL  ? GFR, Estimated >60 >60 mL/min  ?  Comment: (NOTE) ?Calculated using the CKD-EPI Creatinine Equation (2021) ?  ? Anion gap

## 2021-09-01 ENCOUNTER — Encounter: Payer: Self-pay | Admitting: Orthopedic Surgery

## 2021-09-01 ENCOUNTER — Other Ambulatory Visit (HOSPITAL_COMMUNITY): Payer: Self-pay

## 2021-09-01 ENCOUNTER — Encounter (HOSPITAL_COMMUNITY): Payer: Self-pay | Admitting: Orthopedic Surgery

## 2021-09-01 LAB — CULTURE, BLOOD (ROUTINE X 2)
Culture: NO GROWTH
Culture: NO GROWTH

## 2021-09-01 LAB — BASIC METABOLIC PANEL
Anion gap: 9 (ref 5–15)
BUN: 5 mg/dL — ABNORMAL LOW (ref 6–20)
CO2: 25 mmol/L (ref 22–32)
Calcium: 8.6 mg/dL — ABNORMAL LOW (ref 8.9–10.3)
Chloride: 107 mmol/L (ref 98–111)
Creatinine, Ser: 0.56 mg/dL (ref 0.44–1.00)
GFR, Estimated: >60 mL/min (ref >60)
Glucose, Bld: 107 mg/dL — ABNORMAL HIGH (ref 70–99)
Potassium: 4 mmol/L (ref 3.5–5.1)
Sodium: 141 mmol/L (ref 135–145)

## 2021-09-01 LAB — CBC
HCT: 24.3 % — ABNORMAL LOW (ref 36.0–46.0)
Hemoglobin: 7.9 g/dL — ABNORMAL LOW (ref 12.0–15.0)
MCH: 30.3 pg (ref 26.0–34.0)
MCHC: 32.5 g/dL (ref 30.0–36.0)
MCV: 93.1 fL (ref 80.0–100.0)
Platelets: 226 10*3/uL (ref 150–400)
RBC: 2.61 MIL/uL — ABNORMAL LOW (ref 3.87–5.11)
RDW: 15.1 % (ref 11.5–15.5)
WBC: 14 10*3/uL — ABNORMAL HIGH (ref 4.0–10.5)
nRBC: 0.1 % (ref 0.0–0.2)

## 2021-09-01 LAB — HEPATITIS PANEL, ACUTE
HCV Ab: REACTIVE — AB
Hep A IgM: NONREACTIVE
Hep B C IgM: NONREACTIVE
Hepatitis B Surface Ag: NONREACTIVE

## 2021-09-01 LAB — RPR: RPR Ser Ql: NONREACTIVE

## 2021-09-01 MED ORDER — ACETAMINOPHEN 500 MG PO TABS: 1000.0000 mg | ORAL_TABLET | Freq: Four times a day (QID) | ORAL | Status: DC | PRN

## 2021-09-01 MED ORDER — LINEZOLID 600 MG PO TABS
600.0000 mg | ORAL_TABLET | Freq: Two times a day (BID) | ORAL | 0 refills | Status: AC
  Filled 2021-09-01: qty 28, 14d supply, fill #0

## 2021-09-01 MED ORDER — OXYCODONE HCL 5 MG PO TABS
5.0000 mg | ORAL_TABLET | ORAL | Status: DC | PRN
  Administered 2021-09-01: 5 mg via ORAL
  Filled 2021-09-01: qty 1

## 2021-09-01 NOTE — TOC Initial Note (Signed)
Transition of Care (TOC) - Initial/Assessment Note  ? ? ?Patient Details  ?Name: Michele Mahoney ?MRN: [id number] ?Date of Birth: 1990-12-16 ? ?Transition of Care [facility]) CM/SW Contact:    ?Epifanio Lesches, RN ?Phone Number: ?09/01/2021, 7:54 AM ? ?Clinical Narrative:                 ?Readmitted with multiple abscesses on bilateral forearms and secondary bacteremia due to group a streptococcus. Last admit left AMA. Hx of IVDU. ?   S/p I&D, R and L wrist  and forearms, 4/25 ? ?Resides with mom. PTA independendent with ADL's, no DME usage. ? ?ID following... ?If LT IV ABX therapy needed, pt will have to remain hospitalized. It would be hard securing a home health agency 2/ 2 drug usage. ? ?TOC  team  will continue to follow and assist with needs... ? ?Expected Discharge Plan: Home/Self Care ?Barriers to Discharge: Continued Medical Work up ? ? ?Patient Goals and CMS Choice ?  ?  ?  ? ?Expected Discharge Plan and Services ?Expected Discharge Plan: Home/Self Care ?  ?  ?  ?  ?                ?  ?  ?  ?  ?  ?  ?  ?  ?  ?  ? ?Prior Living Arrangements/Services ?  ?  ?  ?       ?  ?  ?  ?  ? ?Activities of Daily Living ?Home Assistive Devices/Equipment: None ?ADL Screening (condition at time of admission) ?Patient's cognitive ability adequate to safely complete daily activities?: Yes ?Is the patient deaf or have difficulty hearing?: No ?Does the patient have difficulty seeing, even when wearing glasses/contacts?: No ?Does the patient have difficulty concentrating, remembering, or making decisions?: No ?Patient able to express need for assistance with ADLs?: No ?Does the patient have difficulty dressing or bathing?: No ?Independently performs ADLs?: Yes (appropriate for developmental age) ?Does the patient have difficulty walking or climbing stairs?: No ?Weakness of Legs: None ?Weakness of Arms/Hands: Both ? ?Permission Sought/Granted ?  ?  ?   ?   ?   ?   ? ?Emotional Assessment ?  ?  ?  ?  ?  ?  ? ?Admission diagnosis:   Abscess of forearm [L02.419] ?Tenosynovitis of forearm [M65.9] ?Arm abscess [L02.419] ?Cellulitis of upper extremity, unspecified laterality [L03.119] ?Patient Active Problem List  ? Diagnosis Date Noted  ? Arm abscess 08/30/2021  ? Soft tissue infection   ? Group A Streptococcal bacteremia   ? Bacteremia   ? Abscess of forearm 08/24/2021  ? Lactic acidosis 08/24/2021  ? IVDU (intravenous drug user) 08/24/2021  ? Hypokalemia 08/24/2021  ? ?PCP:  Richardean Chimera, MD ?Pharmacy:   ?[facility] - [place], [place] - [id number] ?[address] ?[place] [address] ?Phone: [phone] Fax: [phone] ? ?[facility] - [place], [place] - [address] ?[address] ?[place] [address] ?Phone: [phone] Fax: [phone] ? ? ? ? ?Social Determinants of Health (SDOH) Interventions ?  ? ?Readmission Risk Interventions ?   ? View : No data to display.  ?  ?  ?  ? ? ? ?

## 2021-09-01 NOTE — Discharge Summary (Signed)
?AMA Physician Discharge Summary  ?Michele Mahoney KZL:[id number] DOB: Nov 30, 1990 DOA: 08/30/2021 ? ?PCP: Richardean Chimera, MD ? ?Admit date: 08/30/2021 ?Discharge date: 09/01/2021 ? ?Admitted From: Home ?Disposition: Left AGAINST MEDICAL ADVICE ? ? ?History of present illness: ? ?Michele Mahoney is a 31 year old female with past medical history significant for current IV DU with recently diagnosed bilateral forearm abscesses and deep tissue infection with strep pyogenes and strep pyogenes septicemia presented to [facility] ED on 4/24 seeking further medical assistance.  Patient continues with bilateral arm swelling and pain.  Reports one-time dose of IV heroin 2 days ago.  Also has been snorting cocaine due to inability to find venous access. ?  ?Patient went to [facility] on 04/18 for worsening of bilateral arm swelling, rash and pain, she was worked up for bilateral forearm abscesses and deep tissue infection and was supposed to come to [facility] for evaluation by hand surgery for incision and drainage.  Patient left AMA on 08/27/2021. ?  ?In the ED, temperature 97.7 ?F, HR 105, RR 16, BP 122/81, SPO2 100% on room air.  WBC 12.4, hemoglobin 8.6, platelets 238.  Sodium 139, potassium 3.6, chloride 105, CO2 27, glucose 97, BUN 5, creatinine 0.55.  AST 20, ALT 19, total bilirubin 0.1.  Lactic acid 1.7.  CT right hand/forearm with soft tissue edema right forearm/hand with skin thickening consistent with cellulitis, 6.7 x 1.3 x 6.4 cm fluid collection along the superficial aspect distal musculature forearm concerning for abscess, complex fluid collection subcutaneous fat dorsal aspect wrist 8 x 8 x 28 mm concerning for abscess, 1.1 x 0.8 x 19 mm complex fluid collection overlying olecranon consistent with abscess, stable 15 mm linear foreign body subcutaneous fat mid dorsal forearm with portion embedded in the muscle.  CT left hand/forearm worsening flexor tenosynovitis extending from proximal left forearm into palmar aspect  of the hand suspicious for infectious tenosynovitis, no other focal fluid collections identified, stable superficial linear metallic foreign body within the antecubital fossa. ? ?Hospital course: ? ?Streptococcus pyogenes septicemia ?Left wrist tenosynovitis ?Multiple abscesses bilateral upper extremities with foreign body ?Patient presenting to ED after recently leaving AMA from [facility] on 4/21 with continued pain and swelling to bilateral forearms.  Patient was diagnosed with Streptococcus pyogenes septicemia with multiple abscesses to bilateral upper extremities with foreign bodies present.  TTE 08/27/2021 with no evidence of valvular vegetations. Orthopedic hand surgery and infectious disease were consulted and followed during hospital course.  Patient underwent irrigation and debridement by Dr. Yehuda Budd on 08/31/2021.  Patient was continued on vancomycin during hospitalization.  Unfortunately patient decided to leave AGAINST MEDICAL ADVICE prior to receiving a long acting IV antibiotic or oral Zyvox before leaving the hospital.  Unfortunately, believe patient will return to IV drug abuse and given significant wounds and recent bacteremia, may end up losing her arms or ultimately may not end up surviving much longer.   ?  ?Active IV drug abuse ?Continues with current IV drug abuse/skin popping with heroin.  Counseled on need for complete cessation. ? ? ?Reasonable efforts were made to advise the patient of the benefit of staying for evaluation as well as treatment. The patient had the decision-making capacity to refuse treatment at this time. We discussed the associated risks of leaving the hospital prior to completion of workup and treatment; and the patient voiced understanding. We discussed alternatives to current care plan, and the patient still voiced their decision to refuse treatment. Questions were answered and instructions for  need of close follow-up was discussed.  ? ? ? ?Discharge Diagnoses:   ?Principal Problem: ?  Arm abscess ?Active Problems: ?  Abscess of forearm ?  IVDU (intravenous drug user) ?  Group A Streptococcal bacteremia ? ? ? ?Discharge Instructions ? ? ? ? ?Allergies  ?Allergen Reactions  ? Sulfa Antibiotics Rash  ? ? ?Consultations: ?Infectious disease ?Orthopedics, Dr. Yehuda Budd ? ? ?Procedures/Studies: ?DG Forearm Left ? ?Result Date: 08/24/2021 ?CLINICAL DATA:  History of IV drug abuse. Bilateral hand and arm swelling. EXAM: LEFT FOREARM - 2 VIEW COMPARISON:  Wrist radiographs 05/17/2021. FINDINGS: No evidence of acute fracture, dislocation or bone destruction. No significant elbow joint effusion identified. There is diffuse soft tissue swelling with areas of skin ulceration and/or soft tissue emphysema dorsally at the wrist and in the radial aspect of the antecubital fossa. There is a small linear metallic foreign body within the radial aspect of the antecubital fossa. IMPRESSION: 1. Soft tissue swelling with areas of soft tissue emphysema and/or skin ulceration as described, suspicious for soft tissue infection. 2. Linear metallic foreign body within the soft tissues of the antecubital fossa. 3. No evidence of osteomyelitis or septic arthritis. Electronically Signed   By: Carey Bullocks M.D.   On: 08/24/2021 14:00  ? ?DG Forearm Right ? ?Result Date: 08/24/2021 ?CLINICAL DATA:  IV drug abuser with bilateral hand and arm swelling. Evaluate for gas. EXAM: RIGHT FOREARM - 2 VIEW COMPARISON:  Wrist radiographs 05/17/2021.  CT 06/04/2019. FINDINGS: No evidence of acute fracture, dislocation or bone destruction. No evidence of large elbow joint effusion. There is diffuse soft tissue swelling in the forearm with areas of skin ulceration and/or soft tissue emphysema in the radial aspect of the elbow and the radial aspect of the wrist. Two linear metallic foreign bodies are present within the radial soft tissues, at least one of which was present previously. IMPRESSION: 1. Diffuse soft tissue  swelling with areas of soft tissue emphysema and/or skin ulceration as described. Findings suspicious for soft tissue infection. 2. Linear metallic foreign bodies within the soft tissues of the mid forearm, at least one of which was present previously. 3. No evidence of osteomyelitis. Electronically Signed   By: Carey Bullocks M.D.   On: 08/24/2021 13:58  ? ?CT FOREARM LEFT W CONTRAST ? ?Result Date: 08/30/2021 ?CLINICAL DATA:  Soft tissue infection suspected. EXAM: CT OF THE UPPER LEFT EXTREMITY WITH CONTRAST; CT OF THE LEFT HAND WITH CONTRAST TECHNIQUE: Multidetector CT imaging of the upper left extremity was performed from the distal upper arm through the hand according to the standard protocol following intravenous contrast administration. RADIATION DOSE REDUCTION: This exam was performed according to the departmental dose-optimization program which includes automated exposure control, adjustment of the mA and/or kV according to patient size and/or use of iterative reconstruction technique. CONTRAST:  OMNIPAQUE IOHEXOL 300 MG/ML  SOLN COMPARISON:  Prior CTs 08/24/2021. FINDINGS: Bones/Joint/Cartilage No evidence of acute fracture, dislocation or bone destruction. There is no significant elbow or wrist joint effusion or abnormal synovial enhancement. Ligaments Suboptimally assessed by CT. Muscles and Tendons Progressive flexor tendon tenosynovitis with peripheral synovial enhancement extending from the proximal forearm into the hand consistent with progressive infectious tenosynovitis. Within the hand, the greatest involvement is within the thenar and hypothenar eminences. No gross tendon rupture or extension into the fingers. The extensor tendons appear unremarkable. Soft tissues Soft tissue ulceration within the antecubital fossa and adjacent linear metallic foreign body are unchanged. Surrounding subcutaneous edema and skin thickening  in this area are unchanged, without focal fluid collection.  Generalized subcutaneous edema throughout the forearm and hand without other focal fluid collections separate from the extensive flexor tenosynovitis described above. No soft tissue emphysema. Reactive epitrochlear

## 2021-09-01 NOTE — Progress Notes (Signed)
?   ? ? ? ? ?[facility] ? ?Date of Admission:  08/30/2021    ? ? ?Total days of antibiotics 2  ? Vancomycin  ?       ? ?ASSESSMENT: ?Michele Mahoney is a 31 y.o. female with bilateral deep hand/arm abscesses and secondary group a streptococcal bacteremia. Cultures from OR indicate GPCs (likely all strep). Superficial drainage with MRSA. Cleared her bacteremia on 4/20 and now POD1 surgical debridement. Extensive wounds present.  ? ?I think once surgical team is happy with progress of her wounds we can arrange for same day administration of Oritavancin to treat GAS / MRSA soft tissue infection with outpatient follow up in a week to determine if she needs further doses vs PO linezolid to continue treating her infection. She will need close attention to wound care and says her mother can help her with this.  ? ?She seems somewhat open to substance use rehab - she did not like suboxone at [facility] as it induced withdrawals and sounds like it was given too close to last opioid use. Discussed/explained this today. Transportation she says is not a problem for her. She understands current problem is all related to injection drug use and could face more serious infectious and life threatening conditions should this not be addressed.  ? ?Hep panel and RPR pending.  ? ?Fortunately she has had no problem with penicillin - this allergy was removed from her record.  ? ? ?PLAN: ?Continue vancomycin  ?Follow pending hepatitis panel and RPR ?Follow surgery recommendations  ?Plans for administration of long acting IV med + outpatient care to avoid long LOS.  ?Will need help with resources for substance use in [place] - not sure she was interested in inpatient program ? ? ?Principal Problem: ?  Arm abscess ?Active Problems: ?  Group A Streptococcal bacteremia ?  Abscess of forearm ?  IVDU (intravenous drug user) ? ? ? dicyclomine  10 mg Oral TID AC  ? enoxaparin (LOVENOX) injection  40 mg Subcutaneous Q24H   ? folic acid  1 mg Oral Daily  ? ibuprofen  800 mg Oral Once  ? multivitamin with minerals  1 tablet Oral Daily  ? mupirocin ointment  1 application. Nasal BID  ? thiamine  100 mg Oral Daily  ? Or  ? thiamine  100 mg Intravenous Daily  ? ? ?SUBJECTIVE: ?She feels better today. Lives in [place] with her mother (who is also at the bedside today).  ?She has been using intravenous drugs for 2 years now. This is her first serious infection related to this.  ?She  ? ?Review of Systems: ?Review of Systems  ?Constitutional:  Negative for chills and fever.  ?Respiratory:  Negative for cough.   ?Gastrointestinal:  Negative for diarrhea, nausea and vomiting.  ?Skin:  Negative for itching and rash.  ? ? ?Allergies  ?Allergen Reactions  ? Sulfa Antibiotics Rash  ? ? ?OBJECTIVE: ?Vitals:  ? 08/31/21 1745 08/31/21 1934 08/31/21 2354 09/01/21 0933  ?BP: 128/66 113/68 120/61 132/70  ?Pulse: 85 100 81 94  ?Resp: 20 18 17 17   ?Temp: 98 ?F (36.7 ?C) 99.2 ?F (37.3 ?C)  98.3 ?F (36.8 ?C)  ?TempSrc:  Oral  Oral  ?SpO2: 100% 98% 99% 98%  ?Weight:      ?Height:      ? ?Body mass index is 24.03 kg/m?. ? ?Physical Exam ?Vitals reviewed.  ?Constitutional:   ?   Appearance: She is well-developed.  ?  Comments: Seated comfortably in chair.   ?HENT:  ?   Mouth/Throat:  ?   Mouth: No oral lesions.  ?   Dentition: Normal dentition. No dental abscesses.  ?   Comments: Poor dentition  ?Cardiovascular:  ?   Rate and Rhythm: Normal rate and regular rhythm.  ?Pulmonary:  ?   Effort: Pulmonary effort is normal.  ?Abdominal:  ?   General: There is no distension.  ?   Palpations: Abdomen is soft.  ?   Tenderness: There is no abdominal tenderness.  ?Musculoskeletal:  ?   Comments: Bilateral bulky loose kerlix dressings to arms clean and dry   ?Skin: ?   General: Skin is warm and dry.  ?   Findings: No rash.  ?Neurological:  ?   Mental Status: She is alert and oriented to person, place, and time.  ?Psychiatric:     ?   Behavior: Behavior normal.     ?    Thought Content: Thought content normal.     ?   Judgment: Judgment normal.  ? ? ?Lab Results ?Lab Results  ?Component Value Date  ? WBC 14.0 (H) 09/01/2021  ? HGB 7.9 (L) 09/01/2021  ? HCT 24.3 (L) 09/01/2021  ? MCV 93.1 09/01/2021  ? PLT 226 09/01/2021  ?  ?Lab Results  ?Component Value Date  ? CREATININE 0.56 09/01/2021  ? BUN 5 (L) 09/01/2021  ? NA 141 09/01/2021  ? K 4.0 09/01/2021  ? CL 107 09/01/2021  ? CO2 25 09/01/2021  ?  ?Lab Results  ?Component Value Date  ? ALT 19 08/30/2021  ? AST 20 08/30/2021  ? ALKPHOS 78 08/30/2021  ? BILITOT 0.1 (L) 08/30/2021  ?  ? ?Microbiology: ?Recent Results (from the past 240 hour(s))  ?Blood Culture (routine x 2)     Status: Abnormal  ? Collection Time: 08/24/21  1:24 PM  ? Specimen: BLOOD  ?Result Value Ref Range Status  ? Specimen Description   Final  ?  BLOOD BLOOD RIGHT ARM ?Performed at [facility], [address] [place], [place] [address] ?  ? Special Requests   Final  ?  BOTTLES DRAWN AEROBIC AND ANAEROBIC Blood Culture adequate volume ?Performed at [facility], [address] [place], [place] [address] ?  ? Culture  Setup Time   Final  ?  GRAM POSITIVE COCCI ANAEROBIC AND AEROBIC BOTTLES Gram Stain Report Called to,Read Back By and Verified With: ONDARA,M@0545  BY MATTHEWS, B 4.19.2023 ?CRITICAL VALUE NOTED.  VALUE IS CONSISTENT WITH PREVIOUSLY REPORTED AND CALLED VALUE. ?  ? Culture (A)  Final  ?  GROUP A STREP (S.PYOGENES) ISOLATED ?HEALTH DEPARTMENT NOTIFIED ?SUSCEPTIBILITIES PERFORMED ON PREVIOUS CULTURE WITHIN THE LAST 5 DAYS. ?Performed at [facility], [address] [place], [place] [address] ?  ? Report Status 08/27/2021 FINAL  Final  ?Aerobic Culture w Gram Stain (superficial specimen)     Status: None  ? Collection Time: 08/24/21  1:25 PM  ? Specimen: Abscess  ?Result Value Ref Range Status  ? Specimen Description   Final  ?  ABSCESS LEFT ARM ?Performed at [facility], [address] [place], [place] [address] ?  ? Special Requests    Final  ?  NONE ?Performed at [facility], [address] [place], [place] [address] ?  ? Gram Stain   Final  ?  MODERATE WBC PRESENT,BOTH PMN AND MONONUCLEAR ?FEW GRAM POSITIVE COCCI IN PAIRS AND CHAINS ?Performed at [facility], [address] [place], [place] [address] ?  ? Culture   Final  ?  ABUNDANT GROUP A STREP (S.PYOGENES) ISOLATED ?Beta hemolytic streptococci are predictably susceptible to penicillin and other beta lactams. Susceptibility testing not routinely performed. ?FEW METHICILLIN RESISTANT STAPHYLOCOCCUS AUREUS ?  ? Report Status 08/28/2021 FINAL  Final  ? Organism ID, Bacteria METHICILLIN RESISTANT STAPHYLOCOCCUS AUREUS  Final  ?    Susceptibility  ? Methicillin resistant staphylococcus aureus - MIC*  ?  CIPROFLOXACIN <=0.5 SENSITIVE Sensitive   ?  ERYTHROMYCIN <=0.25 SENSITIVE Sensitive   ?  GENTAMICIN <=0.5 SENSITIVE Sensitive   ?  OXACILLIN >=4 RESISTANT Resistant   ?  TETRACYCLINE >=16 RESISTANT Resistant   ?  VANCOMYCIN 1 SENSITIVE Sensitive   ?  TRIMETH/SULFA <=10 SENSITIVE Sensitive   ?  CLINDAMYCIN <=0.25 SENSITIVE Sensitive   ?  RIFAMPIN <=0.5 SENSITIVE Sensitive   ?  Inducible Clindamycin NEGATIVE Sensitive   ?  * FEW METHICILLIN RESISTANT STAPHYLOCOCCUS AUREUS  ?Blood Culture (routine x 2)     Status: Abnormal  ? Collection Time: 08/24/21  2:00 PM  ? Specimen: BLOOD  ?Result Value Ref Range Status  ? Specimen Description   Final  ?  BLOOD BLOOD RIGHT ARM ?Performed at [facility], [address], [place], [place] [address] ?  ? Special Requests   Final  ?  BOTTLES DRAWN AEROBIC AND ANAEROBIC Blood Culture adequate volume ?Performed at [facility], [address], [place], [place] [address] ?  ? Culture  Setup Time   Final  ?  GRAM POSITIVE COCCI ANAEROBIC AND AEROBIC BOTTLES Gram Stain Report Called to,Read Back By and Verified With: ONDARA,M@0545  BY MATTHEWS, B 4.19.203 ?CRITICAL RESULT CALLED TO, READ BACK BY AND VERIFIED WITH: PHARMD G COFFEE [id number] AT 1134 BY CM ?  ? Culture  (A)  Final  ?  GROUP A STREP (S.PYOGENES) ISOLATED ?HEALTH DEPARTMENT NOTIFIED ?Performed at [facility], [address], [place], [place] [address] ?  ? Report Status 08/27/2021 FINAL  Final  ?

## 2021-09-04 LAB — CULTURE, BLOOD (ROUTINE X 2)
Culture: NO GROWTH
Special Requests: ADEQUATE

## 2021-09-05 LAB — AEROBIC/ANAEROBIC CULTURE W GRAM STAIN (SURGICAL/DEEP WOUND): Culture: NO GROWTH

## 2021-09-05 LAB — CULTURE, BLOOD (ROUTINE X 2)
Culture: NO GROWTH
Special Requests: ADEQUATE

## 2021-09-29 LAB — FUNGUS CULTURE WITH STAIN

## 2021-09-29 LAB — FUNGAL ORGANISM REFLEX

## 2021-09-29 LAB — FUNGUS CULTURE RESULT

## 2022-08-10 ENCOUNTER — Ambulatory Visit: Payer: Medicaid Other | Admitting: Adult Health

## 2022-09-26 ENCOUNTER — Encounter: Payer: Self-pay | Admitting: Adult Health

## 2022-09-26 ENCOUNTER — Ambulatory Visit (INDEPENDENT_AMBULATORY_CARE_PROVIDER_SITE_OTHER): Payer: Medicaid Other | Admitting: Adult Health

## 2022-09-26 ENCOUNTER — Other Ambulatory Visit (HOSPITAL_COMMUNITY)
Admission: RE | Admit: 2022-09-26 | Discharge: 2022-09-26 | Disposition: A | Discharge status: Home / Self Care | Payer: Medicaid Other | Source: Ambulatory Visit | Attending: Adult Health | Admitting: Adult Health

## 2022-09-26 VITALS — BP 99/68 | HR 53 | Ht 64.0 in | Wt 146.0 lb

## 2022-09-26 DIAGNOSIS — Z01419 Encounter for gynecological examination (general) (routine) without abnormal findings: Secondary | ICD-10-CM

## 2022-09-26 NOTE — Addendum Note (Signed)
Addended by: Moss Mc on: 09/26/2022 12:36 PM   Modules accepted: Orders
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
## 2022-09-29 LAB — CYTOLOGY - PAP
Chlamydia: NEGATIVE
Comment: NEGATIVE
Comment: NEGATIVE
Comment: NEGATIVE
Comment: NEGATIVE
Comment: NEGATIVE
Comment: NORMAL
Diagnosis: UNDETERMINED — AB
HPV 16: NEGATIVE
HPV 18 / 45: NEGATIVE
High risk HPV: POSITIVE — AB
Neisseria Gonorrhea: NEGATIVE
Trichomonas: POSITIVE — AB

## 2022-10-05 ENCOUNTER — Encounter: Payer: Self-pay | Admitting: Adult Health

## 2022-10-05 ENCOUNTER — Other Ambulatory Visit: Payer: Self-pay | Admitting: Adult Health

## 2022-10-05 DIAGNOSIS — R87610 Atypical squamous cells of undetermined significance on cytologic smear of cervix (ASC-US): Secondary | ICD-10-CM | POA: Insufficient documentation

## 2022-10-05 DIAGNOSIS — A599 Trichomoniasis, unspecified: Secondary | ICD-10-CM | POA: Insufficient documentation

## 2022-10-05 MED ORDER — METRONIDAZOLE 500 MG PO TABS: 500.0000 mg | ORAL_TABLET | Freq: Two times a day (BID) | ORAL | 0 refills | Status: AC

## 2022-10-05 NOTE — Progress Notes (Signed)
+  trich on pap will rx flagyl, no sex or alcohol while taking, partner needs to be treated

## 2022-10-13 ENCOUNTER — Encounter: Payer: Self-pay | Admitting: Adult Health

## 2022-11-17 ENCOUNTER — Telehealth: Payer: Self-pay

## 2022-11-17 NOTE — Telephone Encounter (Signed)
Called patient, voicemail isn't set up. Sent another [employer].

## 2023-01-02 ENCOUNTER — Emergency Department (HOSPITAL_COMMUNITY): Payer: Medicaid Other

## 2023-01-02 ENCOUNTER — Encounter (HOSPITAL_COMMUNITY): Payer: Self-pay

## 2023-01-02 ENCOUNTER — Other Ambulatory Visit: Payer: Self-pay

## 2023-01-02 ENCOUNTER — Emergency Department (HOSPITAL_COMMUNITY)
Admission: EM | Admit: 2023-01-02 | Discharge: 2023-01-02 | Discharge status: Against Medical Advice | Payer: Medicaid Other | Attending: Emergency Medicine | Admitting: Emergency Medicine

## 2023-01-02 DIAGNOSIS — Z5329 Procedure and treatment not carried out because of patient's decision for other reasons: Secondary | ICD-10-CM | POA: Insufficient documentation

## 2023-01-02 DIAGNOSIS — F1110 Opioid abuse, uncomplicated: Secondary | ICD-10-CM | POA: Insufficient documentation

## 2023-01-02 DIAGNOSIS — D72829 Elevated white blood cell count, unspecified: Secondary | ICD-10-CM | POA: Diagnosis not present

## 2023-01-02 DIAGNOSIS — R1084 Generalized abdominal pain: Secondary | ICD-10-CM | POA: Insufficient documentation

## 2023-01-02 DIAGNOSIS — E876 Hypokalemia: Secondary | ICD-10-CM | POA: Diagnosis not present

## 2023-01-02 DIAGNOSIS — F17210 Nicotine dependence, cigarettes, uncomplicated: Secondary | ICD-10-CM | POA: Diagnosis not present

## 2023-01-02 DIAGNOSIS — R61 Generalized hyperhidrosis: Secondary | ICD-10-CM | POA: Diagnosis not present

## 2023-01-02 LAB — CBC WITH DIFFERENTIAL/PLATELET
Abs Immature Granulocytes: 0.13 10*3/uL — ABNORMAL HIGH (ref 0.00–0.07)
Basophils Absolute: 0 10*3/uL (ref 0.0–0.1)
Basophils Relative: 0 %
Eosinophils Absolute: 0.1 10*3/uL (ref 0.0–0.5)
Eosinophils Relative: 0 %
HCT: 40.9 % (ref 36.0–46.0)
Hemoglobin: 14.3 g/dL (ref 12.0–15.0)
Immature Granulocytes: 1 %
Lymphocytes Relative: 15 %
Lymphs Abs: 3.1 10*3/uL (ref 0.7–4.0)
MCH: 31 pg (ref 26.0–34.0)
MCHC: 35 g/dL (ref 30.0–36.0)
MCV: 88.5 fL (ref 80.0–100.0)
Monocytes Absolute: 1.4 10*3/uL — ABNORMAL HIGH (ref 0.1–1.0)
Monocytes Relative: 7 %
Neutro Abs: 15.7 10*3/uL — ABNORMAL HIGH (ref 1.7–7.7)
Neutrophils Relative %: 77 %
Platelets: 259 10*3/uL (ref 150–400)
RBC: 4.62 MIL/uL (ref 3.87–5.11)
RDW: 11.9 % (ref 11.5–15.5)
WBC: 20.4 10*3/uL — ABNORMAL HIGH (ref 4.0–10.5)
nRBC: 0 % (ref 0.0–0.2)

## 2023-01-02 LAB — COMPREHENSIVE METABOLIC PANEL
ALT: 23 U/L (ref 0–44)
AST: 28 U/L (ref 15–41)
Albumin: 4.9 g/dL (ref 3.5–5.0)
Alkaline Phosphatase: 63 U/L (ref 38–126)
Anion gap: 14 (ref 5–15)
BUN: 10 mg/dL (ref 6–20)
CO2: 17 mmol/L — ABNORMAL LOW (ref 22–32)
Calcium: 10.4 mg/dL — ABNORMAL HIGH (ref 8.9–10.3)
Chloride: 104 mmol/L (ref 98–111)
Creatinine, Ser: 0.8 mg/dL (ref 0.44–1.00)
GFR, Estimated: >60 mL/min (ref >60)
Glucose, Bld: 167 mg/dL — ABNORMAL HIGH (ref 70–99)
Potassium: 2.8 mmol/L — ABNORMAL LOW (ref 3.5–5.1)
Sodium: 135 mmol/L (ref 135–145)
Total Bilirubin: 0.8 mg/dL (ref 0.3–1.2)
Total Protein: 9.7 g/dL — ABNORMAL HIGH (ref 6.5–8.1)

## 2023-01-02 LAB — LIPASE, BLOOD: Lipase: 82 U/L — ABNORMAL HIGH (ref 11–51)

## 2023-01-02 LAB — HCG, QUANTITATIVE, PREGNANCY: hCG, Beta Chain, Quant, S: <1 m[IU]/mL (ref <5)

## 2023-01-02 MED ORDER — MORPHINE SULFATE (PF) 4 MG/ML IV SOLN
4.0000 mg | Freq: Once | INTRAVENOUS | Status: AC
  Administered 2023-01-02: 4 mg via INTRAMUSCULAR
  Filled 2023-01-02: qty 1

## 2023-01-02 MED ORDER — ONDANSETRON 8 MG PO TBDP
8.0000 mg | ORAL_TABLET | Freq: Once | ORAL | Status: AC
  Administered 2023-01-02: 8 mg via ORAL
  Filled 2023-01-02: qty 1

## 2023-01-02 MED ORDER — SODIUM CHLORIDE 0.9 % IV BOLUS: 1000.0000 mL | Freq: Once | INTRAVENOUS | Status: DC

## 2023-01-02 MED ORDER — ALUM & MAG HYDROXIDE-SIMETH 200-200-20 MG/5ML PO SUSP
30.0000 mL | Freq: Once | ORAL | Status: AC
  Administered 2023-01-02: 30 mL via ORAL
  Filled 2023-01-02: qty 30

## 2023-01-02 MED ORDER — LIDOCAINE VISCOUS HCL 2 % MT SOLN
15.0000 mL | Freq: Once | OROMUCOSAL | Status: AC
  Administered 2023-01-02: 15 mL via ORAL
  Filled 2023-01-02: qty 15

## 2023-01-02 MED ORDER — POTASSIUM CHLORIDE CRYS ER 20 MEQ PO TBCR
40.0000 meq | EXTENDED_RELEASE_TABLET | Freq: Once | ORAL | Status: AC
  Administered 2023-01-02: 40 meq via ORAL
  Filled 2023-01-02: qty 2

## 2023-01-02 NOTE — ED Notes (Signed)
Pt states she is unable to urinate at this time d/t not drinking fluids. Pt is currently drinking some water. Pt states she gets nauseous after drinking water and taking the meds given to her.

## 2023-01-02 NOTE — ED Notes (Signed)
Pt called out requesting pain medication; informed pt EDP was in an emergency and as soon as he was available he could order her something for pain; pt verbalized understanding

## 2023-01-02 NOTE — ED Notes (Signed)
Pt states she is unable to urinate at this time.

## 2023-01-02 NOTE — ED Triage Notes (Signed)
Pt bib [facility] with heroin withdraws, last time pt used was around 10 pm last night. Pt states symptoms started about 2 hrs ago. Pt complains of N/V/D and all over pain with stomach pain being the worst. Pt states she uses heroin everyday.

## 2023-01-02 NOTE — ED Notes (Addendum)
Unable to get IV access after multiple attempts including ultrasound. EDP aware.

## 2023-01-02 NOTE — ED Provider Notes (Signed)
[place] EMERGENCY DEPARTMENT AT [facility] Provider Note  CSN: [id number] Arrival date & time: 01/02/23 0701  Chief Complaint(s) Withdrawal  HPI Michele Mahoney is a 32 y.o. female with past medical history as below, significant for ASCUS, trichomonias, tenosynovitis of forearm status post I&D, polysubstance abuse who presents to the ED with complaint of concern for opiate withdrawal.  She reports use of heroin daily, last use was last night between 9 and 10 PM.  She snorts the heroin, no longer uses IV heroin due to poor venous access.  She reports she stopped using heroin because she ran out of money and did not have any heroin left.  She is not report desire to cease use of heroin but just reports that she wants to feel better.  Couple hours ago she began having diffuse bodyaches, abdominal pain, nausea and vomiting, diarrhea.  No recent travel or sick contacts.  No change in urination.  No other illicit drug use in the past week.  No daily alcohol use.  Reports she has experienced opiate withdrawal in the past, denies history of rehab  Reports she was recently treated for strep throat with amoxicillin, she stopped taking the amoxicillin because it was giving her upset stomach.  Denies any ongoing symptoms associated with her sore throat  Past Medical History History reviewed. No pertinent past medical history. Patient Active Problem List   Diagnosis Date Noted   Trichomoniasis 10/05/2022   ASCUS with positive high risk HPV cervical 10/05/2022   Arm abscess 08/30/2021   Soft tissue infection    Group A Streptococcal bacteremia    Bacteremia    Abscess of forearm 08/24/2021   Lactic acidosis 08/24/2021   IVDU (intravenous drug user) 08/24/2021   Hypokalemia 08/24/2021   Home Medication(s) Prior to Admission medications   Medication Sig Start Date End Date Taking? Authorizing Provider  acetaminophen (TYLENOL) 500 MG tablet Take 1,000 mg by mouth every 6 (six) hours as  needed for mild pain or moderate pain.  Patient not taking: Reported on 08/30/2021    [provider]  gabapentin (NEURONTIN) 100 MG capsule Take 100 mg by mouth 2 (two) times daily. 08/29/22   [provider]  medroxyPROGESTERone (DEPO-PROVERA) 150 MG/ML injection Inject into the muscle. 08/08/22   [provider]  metroNIDAZOLE (FLAGYL) 500 MG tablet Take 1 tablet (500 mg total) by mouth 2 (two) times daily. 10/05/22   Adline Potter, NP  naproxen (NAPROSYN) 375 MG tablet Take 1 tablet (375 mg total) by mouth 2 (two) times daily. Patient not taking: Reported on 08/24/2021 09/21/19   Arthor Captain, PA-C  pantoprazole (PROTONIX) 40 MG tablet Take 40 mg by mouth daily. 07/06/22   [provider]                                                                                                                                    Past Surgical  History Past Surgical History:  Procedure Laterality Date   FRACTURE SURGERY     I & D EXTREMITY Bilateral 08/31/2021   Procedure: IRRIGATION AND DEBRIDEMENT EXTREMITY;  Surgeon: Gomez Cleverly, MD;  Location: [facility] OR;  Service: Orthopedics;  Laterality: Bilateral;   Family History Family History  Problem Relation Age of Onset   Cancer Paternal Grandmother        stomach   Diabetes Maternal Grandmother    Diabetes Maternal Grandfather    Hypertension Mother    Diabetes Mother    Diabetes Other    Hypertension Other     Social History Social History   Tobacco Use   Smoking status: Every Day    Current packs/day: 0.50    Types: Cigarettes   Smokeless tobacco: Never  Vaping Use   Vaping status: Every Day  Substance Use Topics   Alcohol use: No   Drug use: Yes    Types: IV    Comment: heroin   Allergies Sulfa antibiotics  Review of Systems Review of Systems  Constitutional:  Positive for chills. Negative for fever.  Eyes:  Negative for visual disturbance.  Respiratory:  Negative for chest tightness and  shortness of breath.   Cardiovascular:  Negative for chest pain and palpitations.  Gastrointestinal:  Positive for abdominal pain, diarrhea, nausea and vomiting.  Genitourinary:  Negative for difficulty urinating and urgency.  Musculoskeletal:  Positive for arthralgias.  Psychiatric/Behavioral:  Negative for hallucinations.   All other systems reviewed and are negative.   Physical Exam Vital Signs  I have reviewed the triage vital signs BP 122/86   Pulse 89   Temp 98.4 F (36.9 C) (Oral)   Resp (!) 24   Ht 5\' 4"  (1.626 m)   Wt 63.5 kg   SpO2 97%   BMI 24.03 kg/m  Physical Exam Vitals and nursing note reviewed.  Constitutional:      General: She is not in acute distress.    Appearance: Normal appearance. She is well-developed. She is diaphoretic. She is not ill-appearing.  HENT:     Head: Normocephalic and atraumatic. No right periorbital erythema or left periorbital erythema.     Comments: Poor dentition    Right Ear: External ear normal.     Left Ear: External ear normal.     Nose: Nose normal.     Mouth/Throat:     Mouth: Mucous membranes are moist.  Eyes:     General: No scleral icterus.       Right eye: No discharge.        Left eye: No discharge.  Cardiovascular:     Rate and Rhythm: Normal rate.  Pulmonary:     Effort: Pulmonary effort is normal. No respiratory distress.     Breath sounds: No stridor.  Abdominal:     General: Abdomen is flat. There is no distension.     Palpations: Abdomen is soft.     Tenderness: There is generalized abdominal tenderness. There is no guarding.  Musculoskeletal:        General: No deformity.     Cervical back: No rigidity.  Skin:    General: Skin is warm.     Coloration: Skin is not cyanotic, jaundiced or pale.  Neurological:     Mental Status: She is alert and oriented to person, place, and time.     GCS: GCS eye subscore is 4. GCS verbal subscore is 5. GCS motor subscore is 6.  Psychiatric:  Speech: Speech  normal.        Behavior: Behavior normal. Behavior is cooperative.     ED Results and Treatments Labs (all labs ordered are listed, but only abnormal results are displayed) Labs Reviewed  CBC WITH DIFFERENTIAL/PLATELET - Abnormal; Notable for the following components:      Result Value   WBC 20.4 (*)    Neutro Abs 15.7 (*)    Monocytes Absolute 1.4 (*)    Abs Immature Granulocytes 0.13 (*)    All other components within normal limits  COMPREHENSIVE METABOLIC PANEL - Abnormal; Notable for the following components:   Potassium 2.8 (*)    CO2 17 (*)    Glucose, Bld 167 (*)    Calcium 10.4 (*)    Total Protein 9.7 (*)    All other components within normal limits  LIPASE, BLOOD - Abnormal; Notable for the following components:   Lipase 82 (*)    All other components within normal limits  HCG, QUANTITATIVE, PREGNANCY                                                                                                                          Radiology No results found.  Pertinent labs & imaging results that were available during my care of the patient were reviewed by me and considered in my medical decision making (see MDM for details).  Medications Ordered in ED Medications  ondansetron (ZOFRAN-ODT) disintegrating tablet 8 mg (8 mg Oral Given 01/02/23 0738)  morphine (PF) 4 MG/ML injection 4 mg (4 mg Intramuscular Given 01/02/23 0738)  potassium chloride SA (KLOR-CON M) CR tablet 40 mEq (40 mEq Oral Given 01/02/23 1233)  alum & mag hydroxide-simeth (MAALOX/MYLANTA) 200-200-20 MG/5ML suspension 30 mL (30 mLs Oral Given 01/02/23 1236)    And  lidocaine (XYLOCAINE) 2 % viscous mouth solution 15 mL (15 mLs Oral Given 01/02/23 1236)                                                                                                                                     Procedures Procedures  (including critical care time)  Medical Decision Making / ED Course    Medical Decision Making:     Antonique Armendarez is a 32 y.o. female  with past medical history as below, significant for ASCUS, trichomonias, tenosynovitis of forearm status post I&D, polysubstance abuse who presents to the ED  with complaint of concern for opiate withdrawal. The complaint involves an extensive differential diagnosis and also carries with it a high risk of complications and morbidity.  Serious etiology was considered. Ddx includes but is not limited to: Opiate withdrawal, intoxication, intra-abdominal pathology  Differential diagnosis includes but is not exclusive to acute cholecystitis, intrathoracic causes for epigastric abdominal pain, gastritis, duodenitis, pancreatitis, small bowel or large bowel obstruction, abdominal aortic aneurysm, hernia, gastritis, etc.   Complete initial physical exam performed, notably the patient  was retching, tachycardic, AO x 3.    Reviewed and confirmed nursing documentation for past medical history, family history, social history.  Vital signs reviewed.    Narrative: 32 year old female with history of heroin use here today with concern for heroin withdrawal She denies desire for rehab Her exam is stable, will provide supportive treatment, get screening labs, give fluids.  Clinical Course as of 01/04/23 2023  Mon Jan 02, 2023  2956 Feeling somewhat better, difficult IV access, hx IVDU [SG]  1225 WBC(!): 20.4 Likely 2/2 opiate withdrawal  [SG]  1225 Potassium(!): 2.8 Will replace [SG]  1225 Pt reports ongoing abdominal pain, will get CT and GI cocktail [SG]    Clinical Course User Index [SG] Sloan Leiter, DO    She is feeling much better, recommended further evaluation but pt reported she needed to leave and eloped.   Recommended she f/u with o/p substance abuse clinic  Not intoxicated at time of elopement                 Additional history obtained: -Additional history obtained from na -External records from outside source obtained and reviewed  including: Chart review including previous notes, labs, imaging, consultation notes including  Prior ED visits, prior operative visits, prior labs and imaging, medications   Lab Tests: -I ordered, reviewed, and interpreted labs.   The pertinent results include:   Labs Reviewed  CBC WITH DIFFERENTIAL/PLATELET - Abnormal; Notable for the following components:      Result Value   WBC 20.4 (*)    Neutro Abs 15.7 (*)    Monocytes Absolute 1.4 (*)    Abs Immature Granulocytes 0.13 (*)    All other components within normal limits  COMPREHENSIVE METABOLIC PANEL - Abnormal; Notable for the following components:   Potassium 2.8 (*)    CO2 17 (*)    Glucose, Bld 167 (*)    Calcium 10.4 (*)    Total Protein 9.7 (*)    All other components within normal limits  LIPASE, BLOOD - Abnormal; Notable for the following components:   Lipase 82 (*)    All other components within normal limits  HCG, QUANTITATIVE, PREGNANCY    Notable for as above  EKG   EKG Interpretation Date/Time:  Monday January 02 2023 07:26:42 EDT Ventricular Rate:  81 PR Interval:  132 QRS Duration:  95 QT Interval:  362 QTC Calculation: 421 R Axis:   76  Text Interpretation: Sinus rhythm Biatrial enlargement Probable anteroseptal infarct, old Interpretation limited secondary to artifact Confirmed by Tanda Rockers ([id number]) on 01/02/2023 7:34:12 AM         Imaging Studies ordered: I ordered imaging studies including CT I independently visualized the following imaging with scope of interpretation limited to determining acute life threatening conditions related to emergency care; findings noted above, significant for pt eloped prior to completion  I independently visualized and interpreted imaging. I agree with the radiologist interpretation   Medicines ordered and prescription drug management: Meds  ordered this encounter  Medications   DISCONTD: sodium chloride 0.9 % bolus 1,000 mL   ondansetron (ZOFRAN-ODT)  disintegrating tablet 8 mg   morphine (PF) 4 MG/ML injection 4 mg   potassium chloride SA (KLOR-CON M) CR tablet 40 mEq   AND Linked Order Group    alum & mag hydroxide-simeth (MAALOX/MYLANTA) 200-200-20 MG/5ML suspension 30 mL    lidocaine (XYLOCAINE) 2 % viscous mouth solution 15 mL    -I have reviewed the patients home medicines and have made adjustments as needed   Consultations Obtained: na   Cardiac Monitoring: Continuous pulse oximetry interpreted by myself, 99% on RA.    Social Determinants of Health:  Diagnosis or treatment significantly limited by social determinants of health: polysubstance abuse   Reevaluation: After the interventions noted above, I reevaluated the patient and found that they have improved  Co morbidities that complicate the patient evaluation History reviewed. No pertinent past medical history.    Dispostion: Disposition decision including need for hospitalization was considered, and patient Eloped    Final Clinical Impression(s) / ED Diagnoses Final diagnoses:  Heroin abuse (HCC)        Sloan Leiter, DO 01/04/23 2024

## 2023-01-02 NOTE — ED Notes (Addendum)
Pt stated her mother was here to pick her up and she was leaving. This nurse told pt that she would speak with MD to see if he would discharge her she stated she didn't need papers she was leaving. I asked pt to sign AMA paper and she stated "she didn't need to" pt walked off of unit. Pt seen leaving unit.

## 2023-01-02 NOTE — Discharge Instructions (Signed)
Please stop using heroin  Please follow-up with [facility] with behavioral health urgent care for guidance in regards to rehab and treatment of your addiction

## 2023-07-05 ENCOUNTER — Other Ambulatory Visit (HOSPITAL_COMMUNITY): Payer: Self-pay | Admitting: Family Medicine

## 2023-07-05 DIAGNOSIS — Z1231 Encounter for screening mammogram for malignant neoplasm of breast: Secondary | ICD-10-CM

## 2023-07-13 ENCOUNTER — Ambulatory Visit (HOSPITAL_COMMUNITY)
Admission: RE | Admit: 2023-07-13 | Discharge: 2023-07-13 | Disposition: A | Discharge status: Home / Self Care | Payer: Medicaid Other | Source: Ambulatory Visit | Attending: Family Medicine | Admitting: Family Medicine

## 2023-07-13 ENCOUNTER — Encounter (HOSPITAL_COMMUNITY): Payer: Self-pay

## 2023-07-13 DIAGNOSIS — Z1231 Encounter for screening mammogram for malignant neoplasm of breast: Secondary | ICD-10-CM | POA: Insufficient documentation

## 2023-10-06 ENCOUNTER — Emergency Department (HOSPITAL_COMMUNITY)

## 2023-10-06 ENCOUNTER — Other Ambulatory Visit: Payer: Self-pay

## 2023-10-06 ENCOUNTER — Encounter (HOSPITAL_COMMUNITY): Payer: Self-pay

## 2023-10-06 ENCOUNTER — Emergency Department (HOSPITAL_COMMUNITY)
Admission: EM | Admit: 2023-10-06 | Discharge: 2023-10-06 | Disposition: A | Discharge status: Home / Self Care | Attending: Emergency Medicine | Admitting: Emergency Medicine

## 2023-10-06 DIAGNOSIS — S80212A Abrasion, left knee, initial encounter: Secondary | ICD-10-CM | POA: Diagnosis not present

## 2023-10-06 DIAGNOSIS — S00512A Abrasion of oral cavity, initial encounter: Secondary | ICD-10-CM | POA: Insufficient documentation

## 2023-10-06 DIAGNOSIS — S0011XA Contusion of right eyelid and periocular area, initial encounter: Secondary | ICD-10-CM | POA: Diagnosis not present

## 2023-10-06 DIAGNOSIS — Y92410 Unspecified street and highway as the place of occurrence of the external cause: Secondary | ICD-10-CM | POA: Diagnosis not present

## 2023-10-06 DIAGNOSIS — S0003XA Contusion of scalp, initial encounter: Secondary | ICD-10-CM | POA: Diagnosis not present

## 2023-10-06 DIAGNOSIS — S63502A Unspecified sprain of left wrist, initial encounter: Secondary | ICD-10-CM | POA: Insufficient documentation

## 2023-10-06 DIAGNOSIS — S0083XA Contusion of other part of head, initial encounter: Secondary | ICD-10-CM

## 2023-10-06 DIAGNOSIS — M79642 Pain in left hand: Secondary | ICD-10-CM | POA: Insufficient documentation

## 2023-10-06 DIAGNOSIS — S6992XA Unspecified injury of left wrist, hand and finger(s), initial encounter: Secondary | ICD-10-CM | POA: Diagnosis present

## 2023-10-06 HISTORY — DX: Opioid abuse, uncomplicated: F11.10

## 2023-10-06 MED ORDER — IBUPROFEN 800 MG PO TABS: 800.0000 mg | ORAL_TABLET | Freq: Three times a day (TID) | ORAL | 0 refills | Status: AC
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
## 2023-10-06 NOTE — Discharge Instructions (Signed)
 You may apply ice packs or cool compresses on and off to your face to help with swelling.  Ibuprofen  as directed if needed for pain you may wear the wrist brace as needed for support of your wrist.  Please follow-up with your primary care provider for recheck or return to the emergency department if you develop any new or worsening symptoms
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
## 2024-04-06 IMAGING — CT CT FOREARM*L* W/CM
3 of 5 series · 12 of 36 positions shown, 13 images · IV contrast (agent unspecified)
Comparison: Prior CTs 08/24/2021.

CLINICAL DATA: Soft tissue infection suspected.

EXAM:
CT OF THE UPPER LEFT EXTREMITY WITH CONTRAST; CT OF THE LEFT HAND
WITH CONTRAST
TECHNIQUE: Multidetector CT imaging of the upper left extremity was performed
from the distal upper arm through the hand according to the standard
protocol following intravenous contrast administration.

[Series 5: sag bone · sagittal · 0.39mm/px · 6 of 67 slices shown]
[im 11/67  bone]
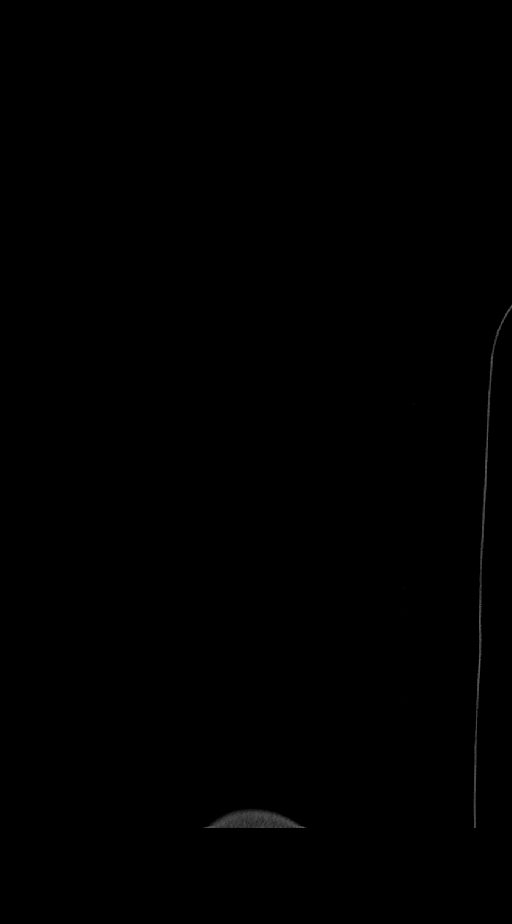
[im 21/67  bone]
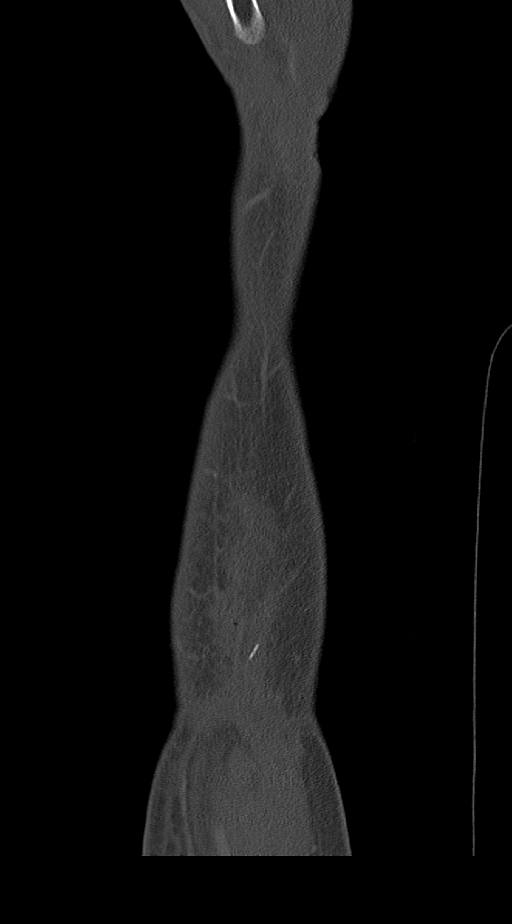
[im 26/67  soft-tissue]
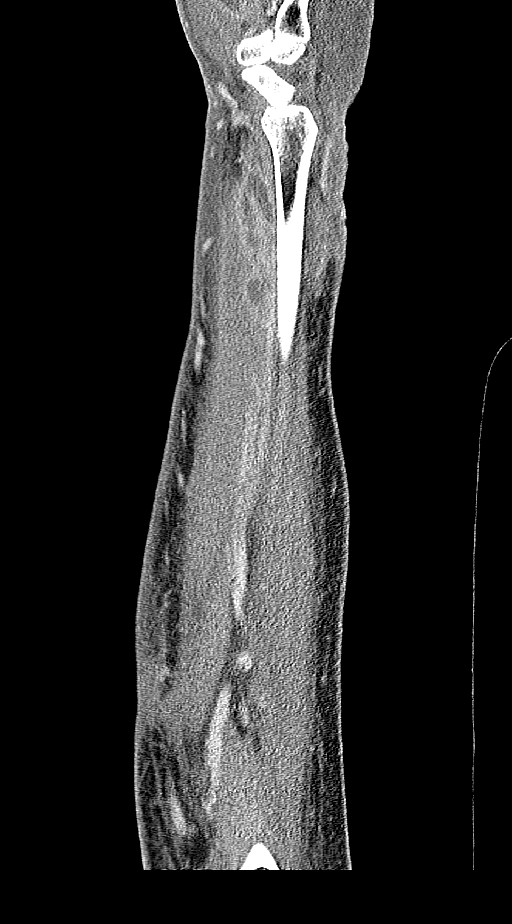
[im 31/67  bone]
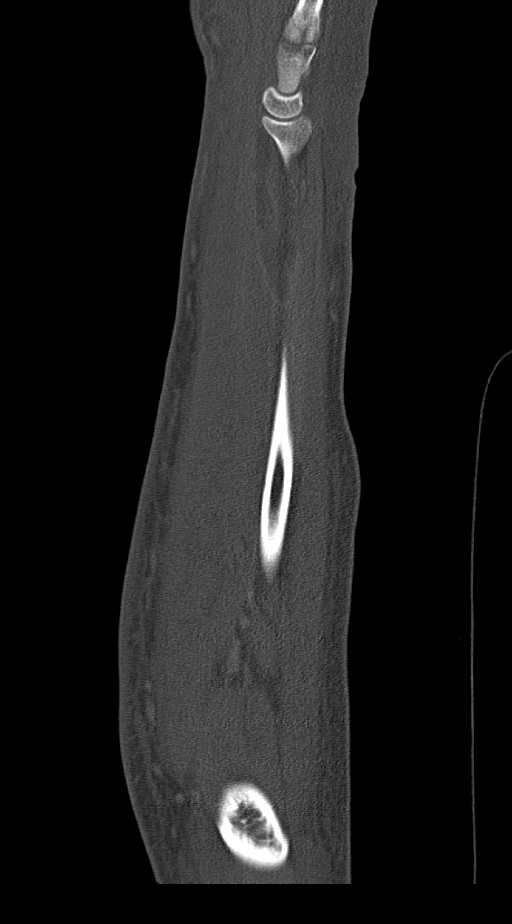
[im 40/67  bone]
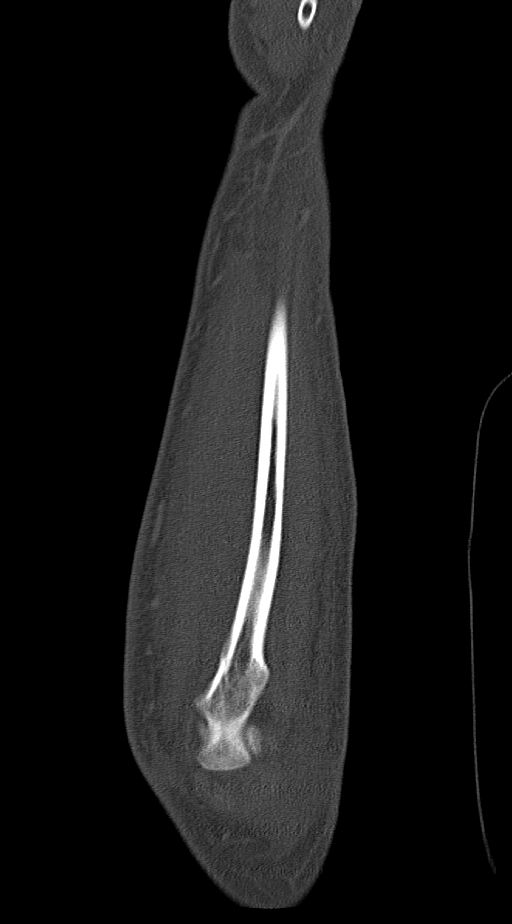
[im 50/67  bone]
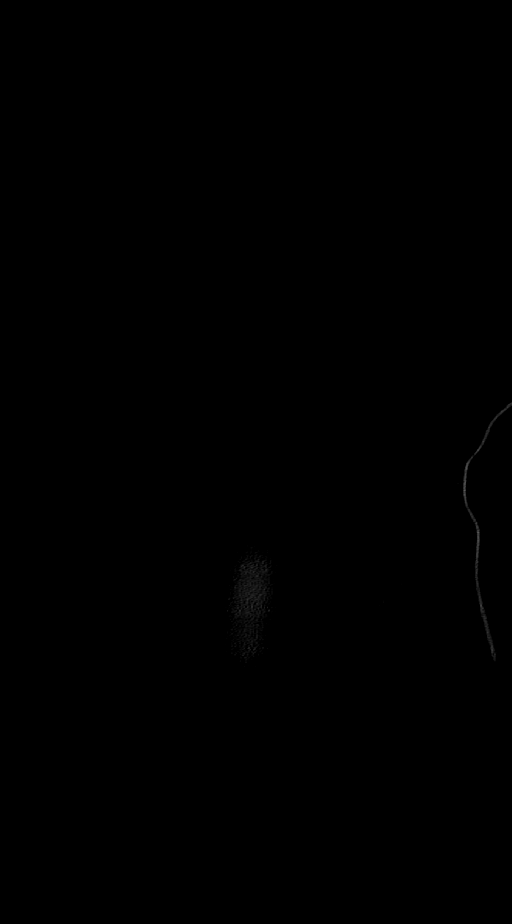

[Series 6: cor st · coronal · 0.47mm/px · 3 of 67 slices shown]
[im 14/67  bone]
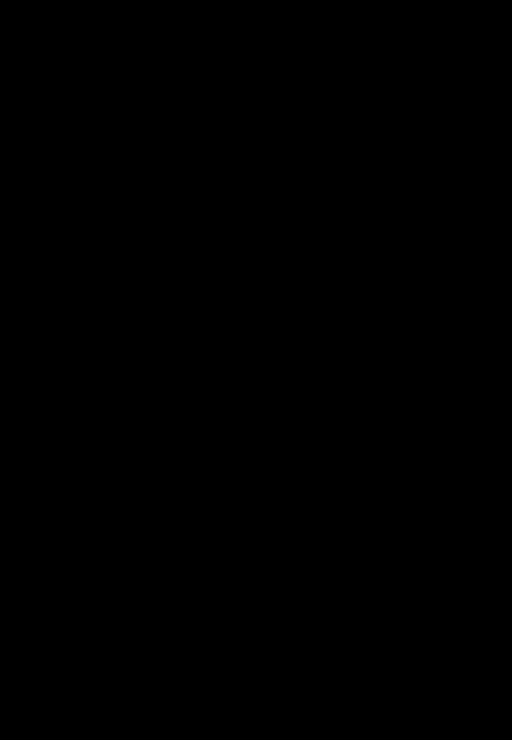
[im 27/67  bone]
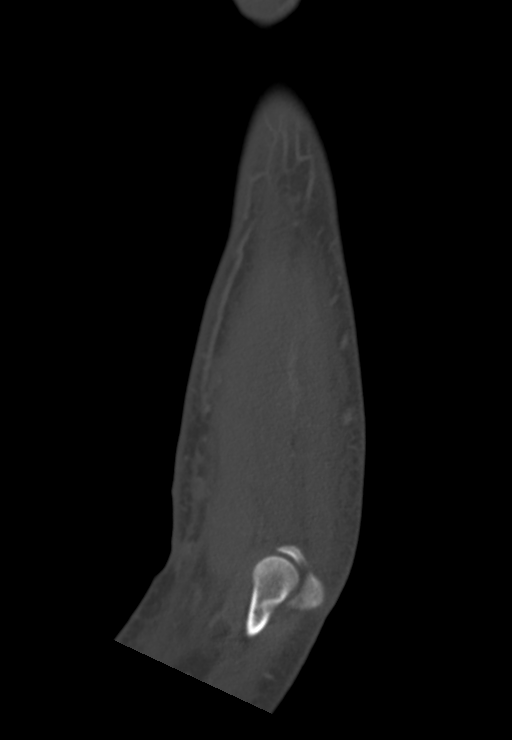
[im 40/67  bone]
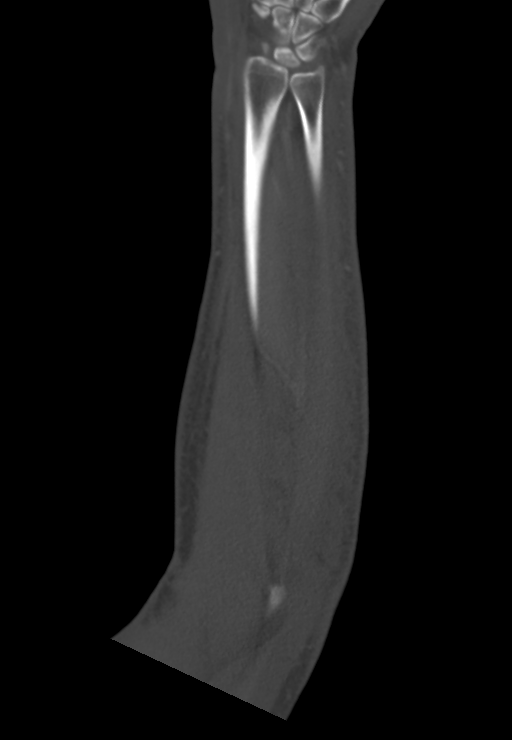

[Series 8: thin bone · axial · 0.43mm/px · z∈[-370,-153]mm · 3 of 653 slices shown, 4 images]
[im 109/653  soft-tissue]
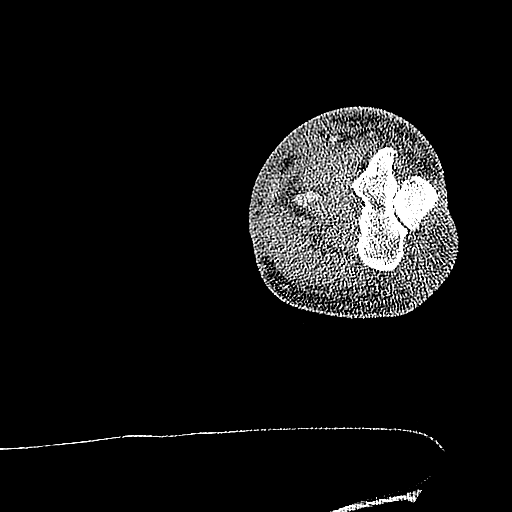
[im 109/653  bone]
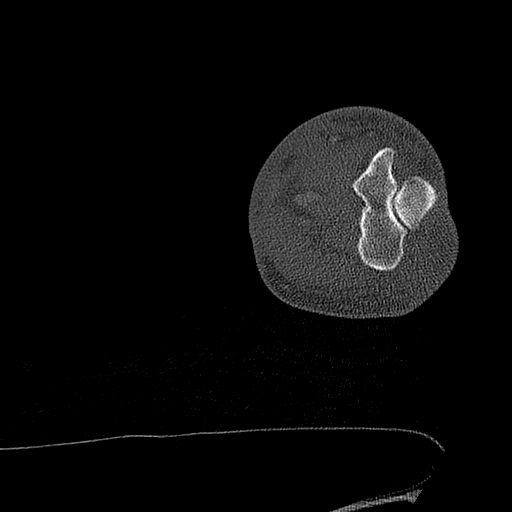
[im 327/653  bone]
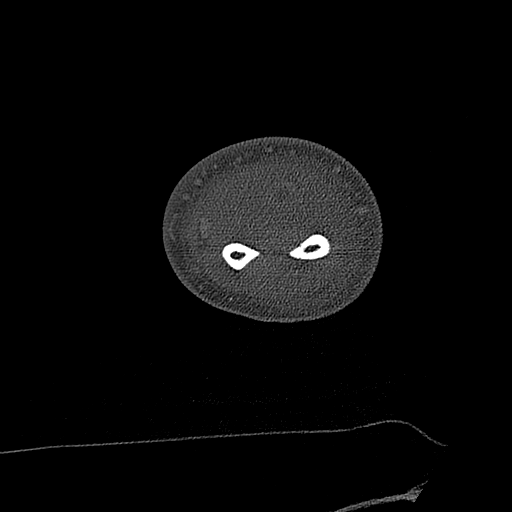
[im 544/653  bone]
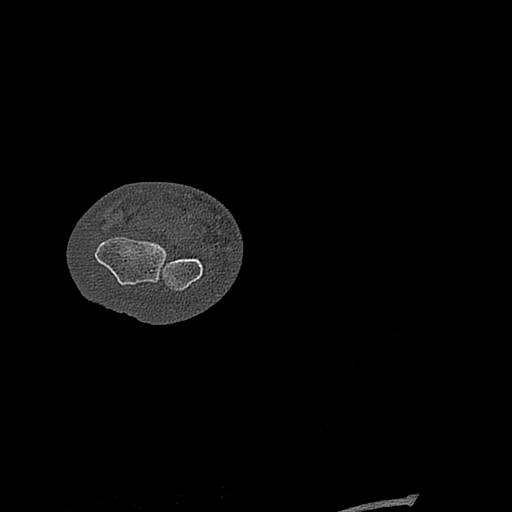

[12 of 36 positions shown; findings below may reference images not displayed]

RADIATION DOSE REDUCTION: This exam was performed according to the
departmental dose-optimization program which includes automated
exposure control, adjustment of the mA and/or kV according to
patient size and/or use of iterative reconstruction technique.

CONTRAST:  100mL OMNIPAQUE IOHEXOL 300 MG/ML  SOLN
FINDINGS: Bones/Joint/Cartilage

No evidence of acute fracture, dislocation or bone destruction.
There is no significant elbow or wrist joint effusion or abnormal
synovial enhancement.

Ligaments

Suboptimally assessed by CT.

Muscles and Tendons

Progressive flexor tendon tenosynovitis with peripheral synovial
enhancement extending from the proximal forearm into the hand
consistent with progressive infectious tenosynovitis. Within the
hand, the greatest involvement is within the thenar and hypothenar
eminences. No gross tendon rupture or extension into the fingers.
The extensor tendons appear unremarkable.

Soft tissues

Soft tissue ulceration within the antecubital fossa and adjacent
linear metallic foreign body are unchanged. Surrounding subcutaneous
edema and skin thickening in this area are unchanged, without focal
fluid collection. Generalized subcutaneous edema throughout the
forearm and hand without other focal fluid collections separate from
the extensive flexor tenosynovitis described above. No soft tissue
emphysema. Reactive epitrochlear lymph nodes are again noted.
IMPRESSION: 1. Compared with the previous CT of 6 days ago, there is worsening
flexor tenosynovitis extending from the proximal left forearm into
the palmar aspect of the hand. There is associated synovial
enhancement, and in this context, these findings are highly
suspicious for infectious tenosynovitis.
2. Apart from the flexor tendons, no other focal fluid collections
are identified.
3. No evidence of septic arthritis or osteomyelitis.
4. Stable superficial linear metallic foreign body within the
antecubital fossa.

## 2024-04-06 IMAGING — CT CT HAND*L* W/CM
3 series · 14 of 33 positions shown, 17 images · IV contrast (agent unspecified)
Comparison: Prior CTs 08/24/2021.

CLINICAL DATA: Soft tissue infection suspected.

EXAM:
CT OF THE UPPER LEFT EXTREMITY WITH CONTRAST; CT OF THE LEFT HAND
WITH CONTRAST
TECHNIQUE: Multidetector CT imaging of the upper left extremity was performed
from the distal upper arm through the hand according to the standard
protocol following intravenous contrast administration.

[Series 2: sag bone · sagittal · 0.37mm/px · 5 of 67 slices shown, 6 images]
[im 23/67  bone]
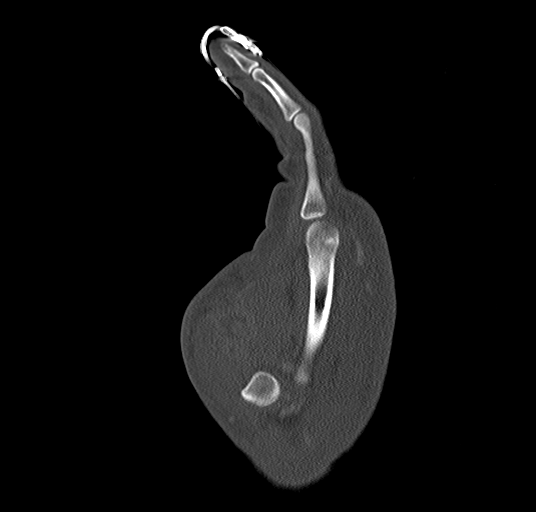
[im 28/67  bone]
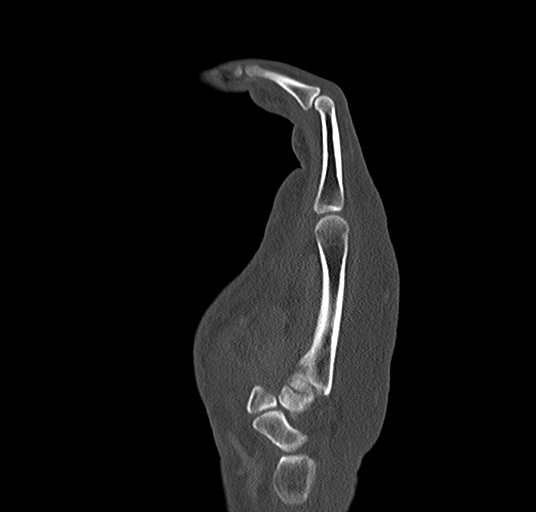
[im 34/67  soft-tissue]
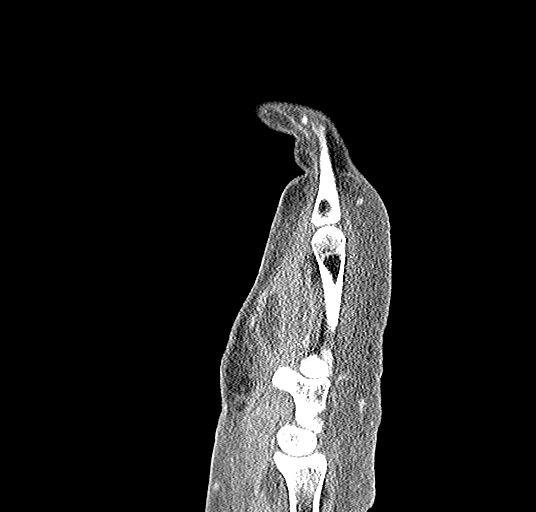
[im 34/67  bone]
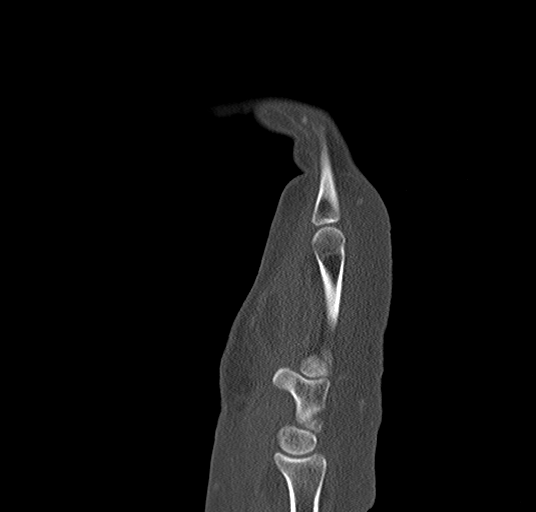
[im 39/67  bone]
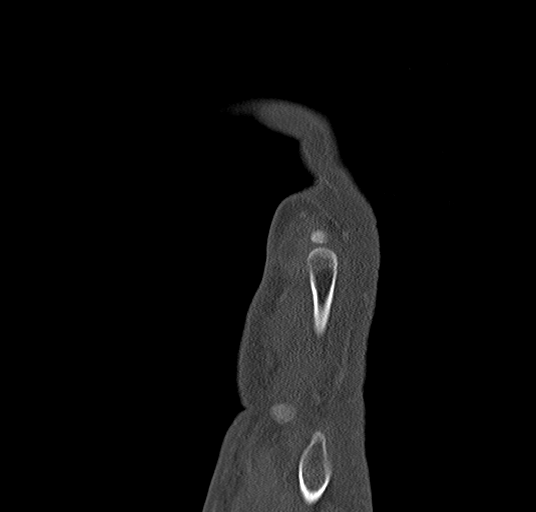
[im 45/67  bone]
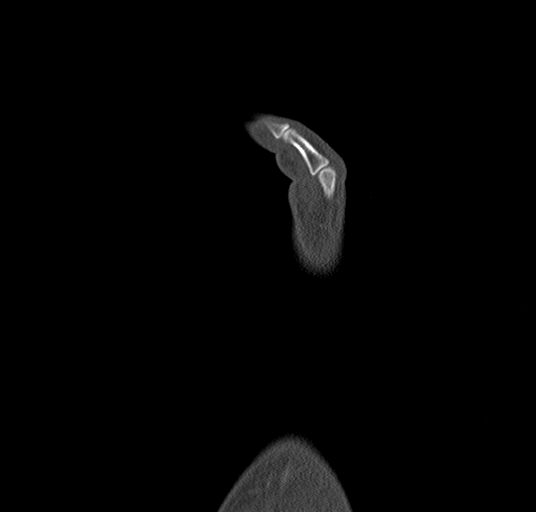

[Series 3: cor st · coronal · 0.37mm/px · 3 of 67 slices shown]
[im 14/67  bone]
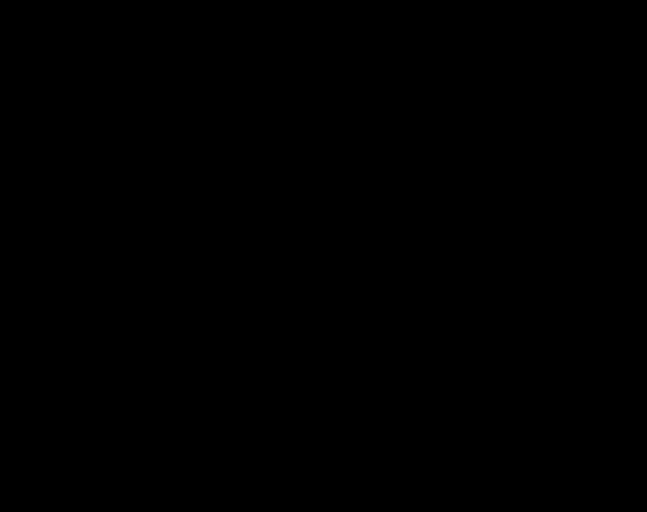
[im 27/67  bone]
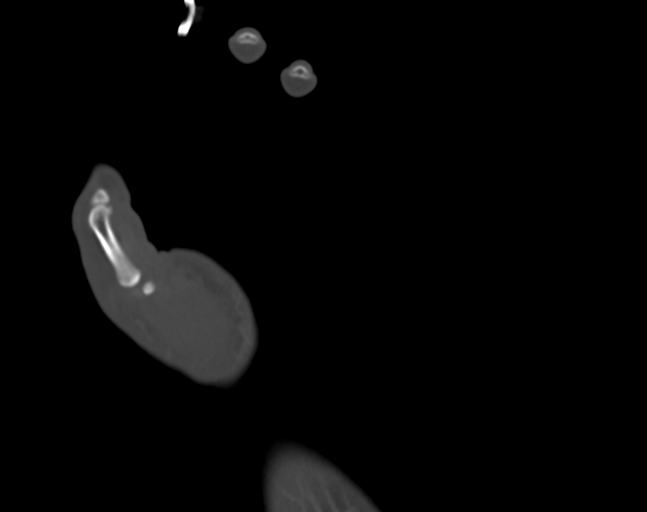
[im 40/67  bone]
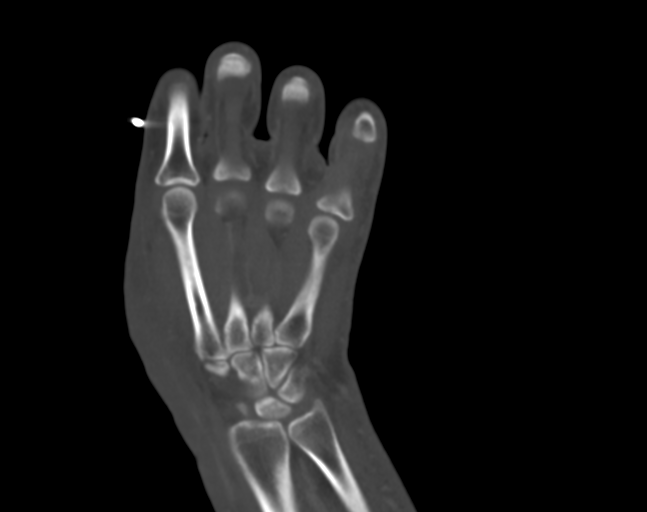

[Series 7: 2 thin st · axial · 0.38mm/px · z∈[-157,+3]mm · 6 of 417 slices shown, 8 images]
[im 65/417  soft-tissue]
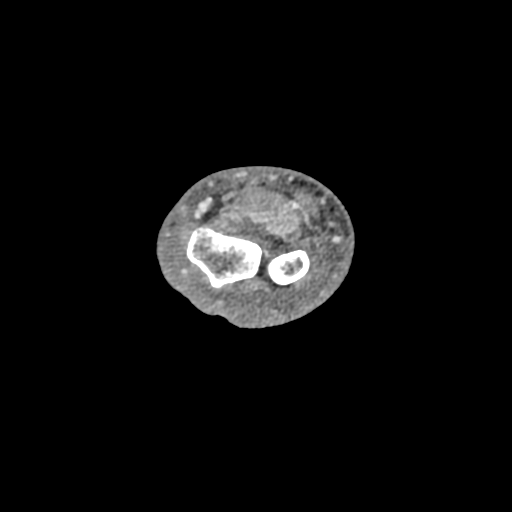
[im 65/417  bone]
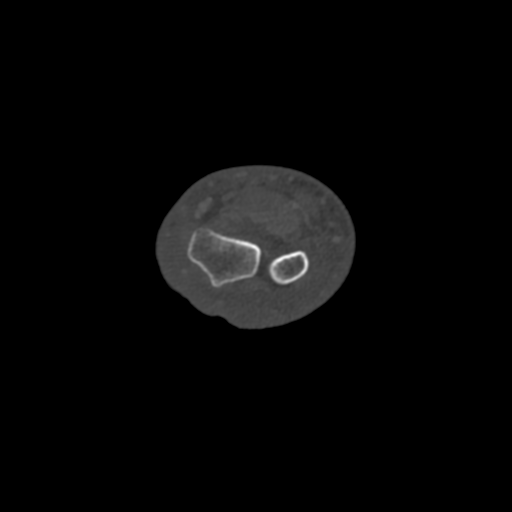
[im 129/417  bone]
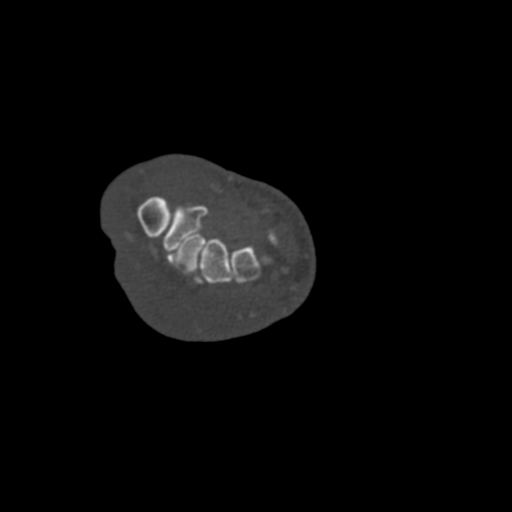
[im 193/417  bone]
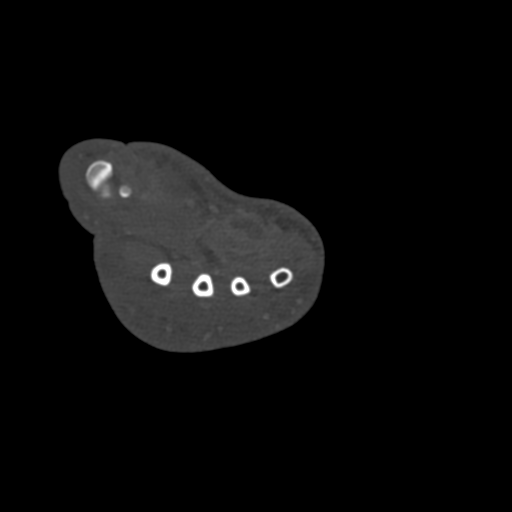
[im 257/417  bone]
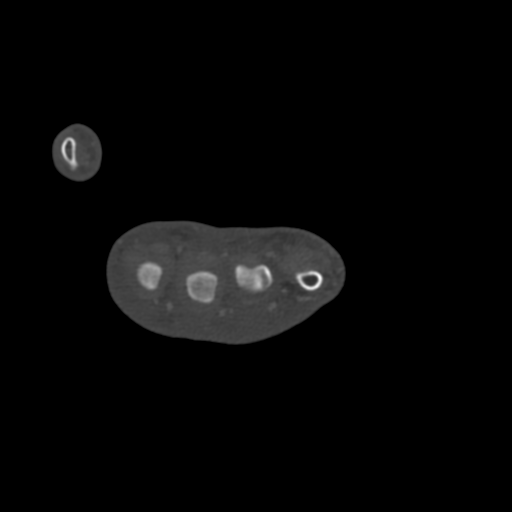
[im 321/417  soft-tissue]
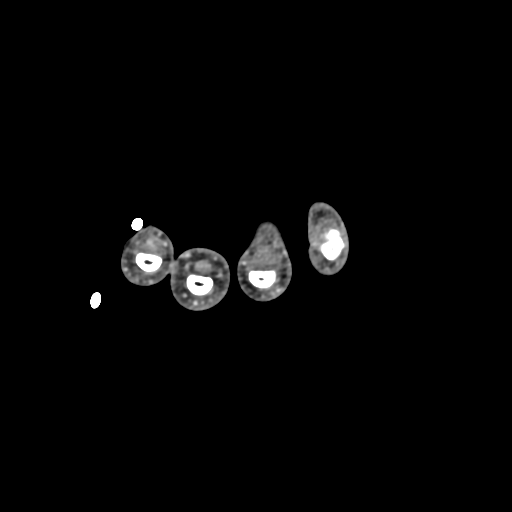
[im 321/417  bone]
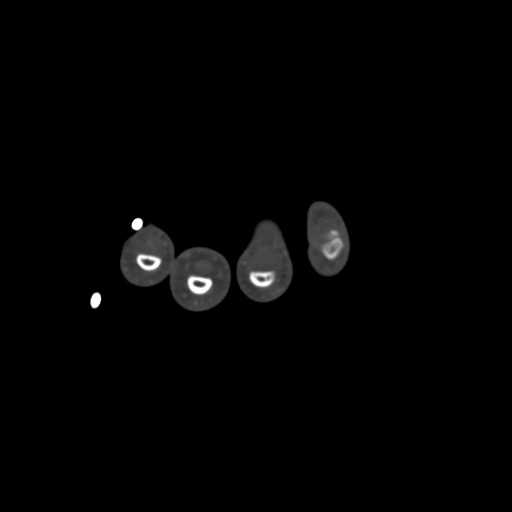
[im 385/417  bone]
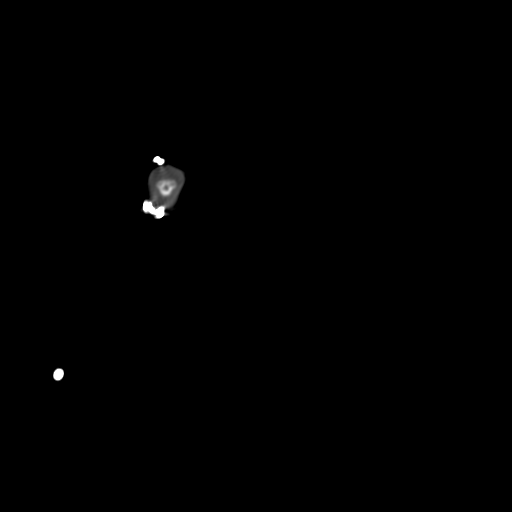

[14 of 33 positions shown; findings below may reference images not displayed]

RADIATION DOSE REDUCTION: This exam was performed according to the
departmental dose-optimization program which includes automated
exposure control, adjustment of the mA and/or kV according to
patient size and/or use of iterative reconstruction technique.

CONTRAST:  100mL OMNIPAQUE IOHEXOL 300 MG/ML  SOLN
FINDINGS: Bones/Joint/Cartilage

No evidence of acute fracture, dislocation or bone destruction.
There is no significant elbow or wrist joint effusion or abnormal
synovial enhancement.

Ligaments

Suboptimally assessed by CT.

Muscles and Tendons

Progressive flexor tendon tenosynovitis with peripheral synovial
enhancement extending from the proximal forearm into the hand
consistent with progressive infectious tenosynovitis. Within the
hand, the greatest involvement is within the thenar and hypothenar
eminences. No gross tendon rupture or extension into the fingers.
The extensor tendons appear unremarkable.

Soft tissues

Soft tissue ulceration within the antecubital fossa and adjacent
linear metallic foreign body are unchanged. Surrounding subcutaneous
edema and skin thickening in this area are unchanged, without focal
fluid collection. Generalized subcutaneous edema throughout the
forearm and hand without other focal fluid collections separate from
the extensive flexor tenosynovitis described above. No soft tissue
emphysema. Reactive epitrochlear lymph nodes are again noted.
IMPRESSION: 1. Compared with the previous CT of 6 days ago, there is worsening
flexor tenosynovitis extending from the proximal left forearm into
the palmar aspect of the hand. There is associated synovial
enhancement, and in this context, these findings are highly
suspicious for infectious tenosynovitis.
2. Apart from the flexor tendons, no other focal fluid collections
are identified.
3. No evidence of septic arthritis or osteomyelitis.
4. Stable superficial linear metallic foreign body within the
antecubital fossa.
